# Patient Record
Sex: Female | Born: 1955 | Race: White | Hispanic: No | Marital: Single | Smoking: Former smoker
Health system: Southern US, Community
[De-identification: ages and names within clinical notes are randomized; demographics above are authoritative.]

## PROBLEM LIST (undated history)

## (undated) DIAGNOSIS — E079 Disorder of thyroid, unspecified: Secondary | ICD-10-CM

## (undated) DIAGNOSIS — I10 Essential (primary) hypertension: Secondary | ICD-10-CM

## (undated) DIAGNOSIS — E039 Hypothyroidism, unspecified: Secondary | ICD-10-CM

## (undated) DIAGNOSIS — M1990 Unspecified osteoarthritis, unspecified site: Secondary | ICD-10-CM

## (undated) DIAGNOSIS — I499 Cardiac arrhythmia, unspecified: Secondary | ICD-10-CM

## (undated) HISTORY — PX: ABDOMINAL HYSTERECTOMY: SHX81

## (undated) HISTORY — DX: Disorder of thyroid, unspecified: E07.9

## (undated) HISTORY — PX: JOINT REPLACEMENT: SHX530

## (undated) HISTORY — PX: BREAST BIOPSY: SHX20

---

## 2011-08-18 DEATH — deceased

## 2014-04-19 HISTORY — PX: OTHER SURGICAL HISTORY: SHX169

## 2018-10-24 ENCOUNTER — Other Ambulatory Visit: Payer: Self-pay | Admitting: Unknown Physician Specialty

## 2018-10-24 DIAGNOSIS — R221 Localized swelling, mass and lump, neck: Secondary | ICD-10-CM

## 2018-10-27 ENCOUNTER — Ambulatory Visit
Admission: RE | Admit: 2018-10-27 | Discharge: 2018-10-27 | Disposition: A | Discharge status: Home / Self Care | Payer: Medicare Other | Source: Ambulatory Visit | Attending: Unknown Physician Specialty | Admitting: Unknown Physician Specialty

## 2018-10-27 ENCOUNTER — Other Ambulatory Visit: Payer: Self-pay

## 2018-10-27 DIAGNOSIS — R221 Localized swelling, mass and lump, neck: Secondary | ICD-10-CM | POA: Insufficient documentation

## 2018-12-28 ENCOUNTER — Other Ambulatory Visit: Payer: Self-pay | Admitting: Internal Medicine

## 2018-12-28 DIAGNOSIS — Z1231 Encounter for screening mammogram for malignant neoplasm of breast: Secondary | ICD-10-CM

## 2018-12-28 DIAGNOSIS — Z13820 Encounter for screening for osteoporosis: Secondary | ICD-10-CM

## 2019-02-19 ENCOUNTER — Ambulatory Visit
Admission: RE | Admit: 2019-02-19 | Discharge: 2019-02-19 | Disposition: A | Discharge status: Home / Self Care | Payer: Medicare Other | Source: Ambulatory Visit | Attending: Internal Medicine | Admitting: Internal Medicine

## 2019-02-19 ENCOUNTER — Other Ambulatory Visit: Payer: Self-pay

## 2019-02-19 DIAGNOSIS — Z13820 Encounter for screening for osteoporosis: Secondary | ICD-10-CM

## 2019-02-19 DIAGNOSIS — Z1231 Encounter for screening mammogram for malignant neoplasm of breast: Secondary | ICD-10-CM

## 2019-02-21 ENCOUNTER — Other Ambulatory Visit: Payer: Self-pay | Admitting: Internal Medicine

## 2019-02-21 DIAGNOSIS — R928 Other abnormal and inconclusive findings on diagnostic imaging of breast: Secondary | ICD-10-CM

## 2019-02-23 ENCOUNTER — Ambulatory Visit: Payer: Medicare Other

## 2019-02-23 ENCOUNTER — Other Ambulatory Visit: Payer: Self-pay

## 2019-02-23 ENCOUNTER — Ambulatory Visit
Admission: RE | Admit: 2019-02-23 | Discharge: 2019-02-23 | Disposition: A | Discharge status: Home / Self Care | Payer: Medicare Other | Source: Ambulatory Visit | Attending: Internal Medicine | Admitting: Internal Medicine

## 2019-02-23 DIAGNOSIS — R928 Other abnormal and inconclusive findings on diagnostic imaging of breast: Secondary | ICD-10-CM

## 2019-03-08 ENCOUNTER — Other Ambulatory Visit: Payer: Self-pay | Admitting: Internal Medicine

## 2019-03-08 DIAGNOSIS — R921 Mammographic calcification found on diagnostic imaging of breast: Secondary | ICD-10-CM

## 2019-03-21 ENCOUNTER — Ambulatory Visit
Admission: RE | Admit: 2019-03-21 | Discharge: 2019-03-21 | Disposition: A | Discharge status: Home / Self Care | Payer: Medicare Other | Source: Ambulatory Visit | Attending: Internal Medicine | Admitting: Internal Medicine

## 2019-03-21 ENCOUNTER — Other Ambulatory Visit: Payer: Self-pay

## 2019-03-21 DIAGNOSIS — R921 Mammographic calcification found on diagnostic imaging of breast: Secondary | ICD-10-CM

## 2019-07-09 ENCOUNTER — Other Ambulatory Visit: Payer: Self-pay | Admitting: Internal Medicine

## 2019-07-09 ENCOUNTER — Ambulatory Visit
Admission: RE | Admit: 2019-07-09 | Discharge: 2019-07-09 | Disposition: A | Discharge status: Home / Self Care | Payer: Medicare Other | Source: Ambulatory Visit | Attending: Internal Medicine | Admitting: Internal Medicine

## 2019-07-09 ENCOUNTER — Other Ambulatory Visit: Payer: Self-pay

## 2019-07-09 DIAGNOSIS — R921 Mammographic calcification found on diagnostic imaging of breast: Secondary | ICD-10-CM

## 2019-12-05 ENCOUNTER — Encounter: Payer: Self-pay | Admitting: Physical Medicine and Rehabilitation

## 2020-02-29 ENCOUNTER — Other Ambulatory Visit: Payer: Self-pay

## 2020-02-29 ENCOUNTER — Ambulatory Visit
Admission: RE | Admit: 2020-02-29 | Discharge: 2020-02-29 | Disposition: A | Discharge status: Home / Self Care | Payer: Medicare Other | Source: Ambulatory Visit | Attending: Internal Medicine | Admitting: Internal Medicine

## 2020-02-29 DIAGNOSIS — R921 Mammographic calcification found on diagnostic imaging of breast: Secondary | ICD-10-CM

## 2020-06-09 ENCOUNTER — Encounter: Payer: Self-pay | Admitting: Physical Medicine and Rehabilitation

## 2020-07-07 ENCOUNTER — Encounter
Payer: Medicare Other | Attending: Physical Medicine and Rehabilitation | Admitting: Physical Medicine and Rehabilitation

## 2020-07-07 ENCOUNTER — Other Ambulatory Visit: Payer: Self-pay

## 2020-07-07 ENCOUNTER — Ambulatory Visit
Admission: RE | Admit: 2020-07-07 | Discharge: 2020-07-07 | Disposition: A | Discharge status: Home / Self Care | Payer: Medicare Other | Source: Ambulatory Visit | Attending: Physical Medicine and Rehabilitation | Admitting: Physical Medicine and Rehabilitation

## 2020-07-07 ENCOUNTER — Encounter: Payer: Self-pay | Admitting: Physical Medicine and Rehabilitation

## 2020-07-07 VITALS — BP 186/106 | HR 56 | Temp 97.9°F | Ht 66.0 in | Wt 189.4 lb

## 2020-07-07 DIAGNOSIS — M792 Neuralgia and neuritis, unspecified: Secondary | ICD-10-CM | POA: Diagnosis present

## 2020-07-07 DIAGNOSIS — M79604 Pain in right leg: Secondary | ICD-10-CM | POA: Insufficient documentation

## 2020-07-07 DIAGNOSIS — G629 Polyneuropathy, unspecified: Secondary | ICD-10-CM | POA: Insufficient documentation

## 2020-07-07 DIAGNOSIS — M545 Low back pain, unspecified: Secondary | ICD-10-CM | POA: Diagnosis present

## 2020-07-07 MED ORDER — DULOXETINE HCL 30 MG PO CPEP: 30.0000 mg | ORAL_CAPSULE | Freq: Every day | ORAL | 3 refills | Status: DC

## 2020-07-07 MED ORDER — MELOXICAM 15 MG PO TABS: 15.0000 mg | ORAL_TABLET | Freq: Every day | ORAL | 5 refills | Status: DC

## 2020-07-07 NOTE — Progress Notes (Signed)
Subjective:    Patient ID: Monique Harrell, female    DOB: December 24, 1955, 65 y.o.   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   Is fairly active- when can be- if climbs ladder, awful, hurts worse.   It's excruciating.  From R lateral hip down to R toes. Has associated   R knee pretty chronic pain R hip pain is disabling.  NO PAIN IN GROIN.   Lateral R hip and low back pain.  Above  Numb and tingling down R leg- down to R ankle laterally.  When tries to accomplish tasks, gets super aggravated.   Has gotten knee injections- gel injections. Didn't help- a few months ago.  The R knee "feels horrible". But told wasn't bad in terms of maybe moderate OA, but not severe.  Dr Madelon Lips must have placed Xray at their office, because not in system.    No pain in L side, only R side.   Pins and needles, sharp, shooting, sharp. All nerve pain Sx's.   Low dose Naltrexone- not taking now- was for weight loss? No side effects.  Hasn't occurred to her to see if could help with weight loss.   Had a Rx for Meloxicam- not sure if helped- took as needed- 3x/week or so.   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      Pain Inventory Average Pain 7 Pain Right Now 5 My pain is constant, sharp, dull, tingling and aching  In the last 24 hours, has pain interfered with the following? General activity 9 Relation with others 1 Enjoyment of life 9 What TIME of day is your pain at its worst? morning  and night Sleep (in general) Fair  Pain is worse with: some activites Pain improves with: medication and nothing  Relief from Meds: 5  walk without assistance how many minutes can you walk? varies ability to climb steps?  yes  retired  numbness tingling dizziness  Any  changes since last visit?  no  Any changes since last visit?  no    Family History  Problem Relation Age of Onset  . Breast cancer Mother   . Cancer Mother   . Cancer Father   . Cancer Brother    Social History   Socioeconomic History  . Marital status: Single    Spouse name: Not on file  . Number of children: Not on file  . Years of education: Not on file  . Highest education level: Not on file  Occupational History  . Not on file  Tobacco Use  . Smoking status: Not on file  . Smokeless tobacco: Not on file  Substance and Sexual Activity  . Alcohol use: Not on file  . Drug use: Not on file  . Sexual activity: Not on file  Other Topics Concern  . Not on file  Social History Narrative  . Not on file   Social Determinants of Health   Financial Resource Strain: Not on file  Food Insecurity: Not on file  Transportation Needs: Not on file  Physical Activity: Not on file  Stress: Not on file  Social Connections: Not on file   Past Surgical History:  Procedure Laterality Date  . ABDOMINAL HYSTERECTOMY     at  age 45  . BREAST BIOPSY Right   . JOINT REPLACEMENT Right    @ age 74 in [place] Dr Salomon Fick  . thyroidectomy  2016   Past Medical History:  Diagnosis Date  . Thyroid disease    BP (!) 186/106   Pulse (!) 56   Temp 97.9 F (36.6 C)   Ht 5\' 6"  (1.676 m)   Wt 189 lb 6.4 oz (85.9 kg)   SpO2 96%   BMI 30.57 kg/m   Opioid Risk Score:   Fall Risk Score:  `1  Depression screen PHQ 2/9  Depression screen PHQ 2/9 07/07/2020  Decreased Interest 0  Down, Depressed, Hopeless 0  PHQ - 2 Score 0  Altered sleeping 1  Tired, decreased energy 0  Change in appetite 0  Feeling bad or failure about yourself  0  Trouble concentrating 0  Moving slowly or fidgety/restless 0  Suicidal thoughts 0  PHQ-9 Score 1  Difficult doing work/chores Extremely dIfficult   , Review of Systems  Constitutional: Negative.   HENT: Negative.   Eyes: Negative.   Cardiovascular:  Negative.   Gastrointestinal: Negative.   Endocrine: Negative.   Genitourinary: Negative.   Musculoskeletal: Positive for arthralgias and gait problem.       Hip pain  Skin: Negative.   Allergic/Immunologic: Negative.   Neurological: Positive for numbness.       Tingling  Hematological: Negative.   Psychiatric/Behavioral: Negative.   All other systems reviewed and are negative.      Objective:   Physical Exam  Awake, alert, appropriate, sitting on table- keeps rubbing R lateral hip, NAD  MS: LLE- HF, KE, KF, DF and PF 5/5 RLE- HF 5-/5, KE, KF, DF and PF- 5/5 otherwise  Neuro: Intact to light touch in B/L LEs No pain with extension of lumbar spine- B/L No pain with rotation of lumbar spine (+) SLR on R Increased pain with lumbar flexion        Assessment & Plan:  Pt is a 65 yr old female with hx of HLD and hypothyroidism here for R low back pain/neuropathy  1. 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  Go to get xray at 69- can just walk in.   2. BP is very high- per pt, rarely elevated- so pt isn't concerned  3. Duloxetine   Duloxetine /Cymbalta 30 mg nightly x 1 week  Then 60 mg nightly- for nerve pain  1% of patients can have nausea with Duloxetine- call me if needs an anti-nausea medicine. Can also cause mild dry mouth/dry eyes and mild constipation. Takes 7-14 days to start to kick in.   4. Will also send in Meloxicam - 15 mg daily as needed for nerve pain.    5. F/U in 8 weeks.   6. Do HEP 5+ days/week  I spent a total of 40 minutes n appointment- as detailed above.

## 2020-07-07 NOTE — Patient Instructions (Signed)
Pt is a 66 yr old female with hx of HLD and hypothyroidism here for R low back pain/neuropathy  1. 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  Go to get xray at [facility]- can just walk in.   2. BP is very high- per pt, rarely elevated- so pt isn't concerned  3. Duloxetine   Duloxetine /Cymbalta 30 mg nightly x 1 week  Then 60 mg nightly- for nerve pain  1% of patients can have nausea with Duloxetine- call me if needs an anti-nausea medicine. Can also cause mild dry mouth/dry eyes and mild constipation. Takes 7-14 days to start to kick in.   4. Will also send in Meloxicam - 15 mg daily as needed for nerve pain.    5. F/U in 8 weeks.   6. Do Home exercise program 5 days/week.

## 2020-09-05 ENCOUNTER — Encounter: Payer: Medicare Other | Admitting: Physical Medicine and Rehabilitation

## 2021-01-22 ENCOUNTER — Other Ambulatory Visit: Payer: Self-pay | Admitting: Family Medicine

## 2021-01-22 DIAGNOSIS — R921 Mammographic calcification found on diagnostic imaging of breast: Secondary | ICD-10-CM

## 2021-04-07 ENCOUNTER — Ambulatory Visit
Admission: RE | Admit: 2021-04-07 | Discharge: 2021-04-07 | Disposition: A | Discharge status: Home / Self Care | Payer: Medicare Other | Source: Ambulatory Visit | Attending: Family Medicine | Admitting: Family Medicine

## 2021-04-07 ENCOUNTER — Other Ambulatory Visit: Payer: Self-pay

## 2021-04-07 ENCOUNTER — Other Ambulatory Visit: Payer: Self-pay | Admitting: Family Medicine

## 2021-04-07 DIAGNOSIS — R921 Mammographic calcification found on diagnostic imaging of breast: Secondary | ICD-10-CM

## 2021-04-07 DIAGNOSIS — N6320 Unspecified lump in the left breast, unspecified quadrant: Secondary | ICD-10-CM

## 2021-09-29 ENCOUNTER — Other Ambulatory Visit: Payer: Self-pay | Admitting: Family Medicine

## 2021-09-29 DIAGNOSIS — E2839 Other primary ovarian failure: Secondary | ICD-10-CM

## 2022-03-09 ENCOUNTER — Ambulatory Visit
Admission: RE | Admit: 2022-03-09 | Discharge: 2022-03-09 | Disposition: A | Discharge status: Home / Self Care | Payer: Medicare Other | Source: Ambulatory Visit | Attending: Family Medicine | Admitting: Family Medicine

## 2022-03-09 DIAGNOSIS — E2839 Other primary ovarian failure: Secondary | ICD-10-CM

## 2022-03-15 ENCOUNTER — Other Ambulatory Visit: Payer: Self-pay | Admitting: Family Medicine

## 2022-03-15 DIAGNOSIS — Z1231 Encounter for screening mammogram for malignant neoplasm of breast: Secondary | ICD-10-CM

## 2022-05-10 ENCOUNTER — Ambulatory Visit
Admission: RE | Admit: 2022-05-10 | Discharge: 2022-05-10 | Disposition: A | Discharge status: Home / Self Care | Payer: Medicare Other | Source: Ambulatory Visit | Attending: Family Medicine | Admitting: Family Medicine

## 2022-05-10 DIAGNOSIS — Z1231 Encounter for screening mammogram for malignant neoplasm of breast: Secondary | ICD-10-CM

## 2022-11-01 ENCOUNTER — Ambulatory Visit: Payer: Self-pay

## 2022-11-01 ENCOUNTER — Ambulatory Visit
Admission: EM | Admit: 2022-11-01 | Discharge: 2022-11-01 | Disposition: A | Discharge status: Home / Self Care | Payer: Medicare Other | Attending: Emergency Medicine | Admitting: Emergency Medicine

## 2022-11-01 DIAGNOSIS — S0501XA Injury of conjunctiva and corneal abrasion without foreign body, right eye, initial encounter: Secondary | ICD-10-CM

## 2022-11-01 MED ORDER — KETOROLAC TROMETHAMINE 0.5 % OP SOLN: 1.0000 [drp] | Freq: Three times a day (TID) | OPHTHALMIC | 0 refills | Status: DC | PRN

## 2022-11-01 MED ORDER — ERYTHROMYCIN 5 MG/GM OP OINT: TOPICAL_OINTMENT | OPHTHALMIC | 0 refills | Status: DC

## 2022-11-01 NOTE — Discharge Instructions (Addendum)
Today you were evaluated for your right eye pain, on exam I am able to notate a small corneal abrasion on your eye which typically will improve with time  Apply erythromycin ointment to the lower water line every morning and every evening for 7 days  Place 1 drop of ketorolac into the eye every 8 hours as needed for pain  May hold cool compresses to the eye for comfort as needed  Try to avoid eye touching or rubbing to prevent further irritation  If no improvement has been seen within 72 hours please notify your eye doctor to schedule follow-up appointment for reevaluation  For any further visual changes please go to the nearest emergency department for immediate evaluation
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
## 2023-01-05 ENCOUNTER — Ambulatory Visit
Admission: RE | Admit: 2023-01-05 | Discharge: 2023-01-05 | Disposition: A | Discharge status: Home / Self Care | Payer: Medicare Other | Source: Ambulatory Visit | Attending: Family Medicine | Admitting: Family Medicine

## 2023-01-05 ENCOUNTER — Other Ambulatory Visit: Payer: Self-pay | Admitting: Family Medicine

## 2023-01-05 DIAGNOSIS — M25551 Pain in right hip: Secondary | ICD-10-CM

## 2023-04-25 ENCOUNTER — Other Ambulatory Visit: Payer: Self-pay | Admitting: Family Medicine

## 2023-04-25 DIAGNOSIS — Z1231 Encounter for screening mammogram for malignant neoplasm of breast: Secondary | ICD-10-CM

## 2023-05-12 ENCOUNTER — Ambulatory Visit
Admission: RE | Admit: 2023-05-12 | Discharge: 2023-05-12 | Disposition: A | Discharge status: Home / Self Care | Payer: Medicare Other | Source: Ambulatory Visit | Attending: Family Medicine | Admitting: Family Medicine

## 2023-05-12 DIAGNOSIS — Z1231 Encounter for screening mammogram for malignant neoplasm of breast: Secondary | ICD-10-CM

## 2023-05-26 ENCOUNTER — Other Ambulatory Visit: Payer: Self-pay | Admitting: Student

## 2023-05-26 DIAGNOSIS — Z1211 Encounter for screening for malignant neoplasm of colon: Secondary | ICD-10-CM

## 2023-06-04 IMAGING — MG DIGITAL DIAGNOSTIC BILAT W/ TOMO W/ CAD
8 of 12 series · 8 of 32 positions shown · non-contrast
Comparison: Previous exam(s).

CLINICAL DATA: Follow-up for probably benign calcifications in the
RIGHT breast. These calcifications were originally identified in
Monday February, 2019. Patient describes previous history of RIGHT breast
surgical excision with large hematoma.

Patient also describes a new palpable lump within the inframammary
fold of the lower LEFT breast.
EXAM:
DIGITAL DIAGNOSTIC BILATERAL MAMMOGRAM WITH TOMOSYNTHESIS AND CAD;
ULTRASOUND LEFT BREAST LIMITED
TECHNIQUE: Bilateral digital diagnostic mammography and breast tomosynthesis
was performed. The images were evaluated with computer-aided
detection.; Targeted ultrasound examination of the left breast was
performed.

[R ML]
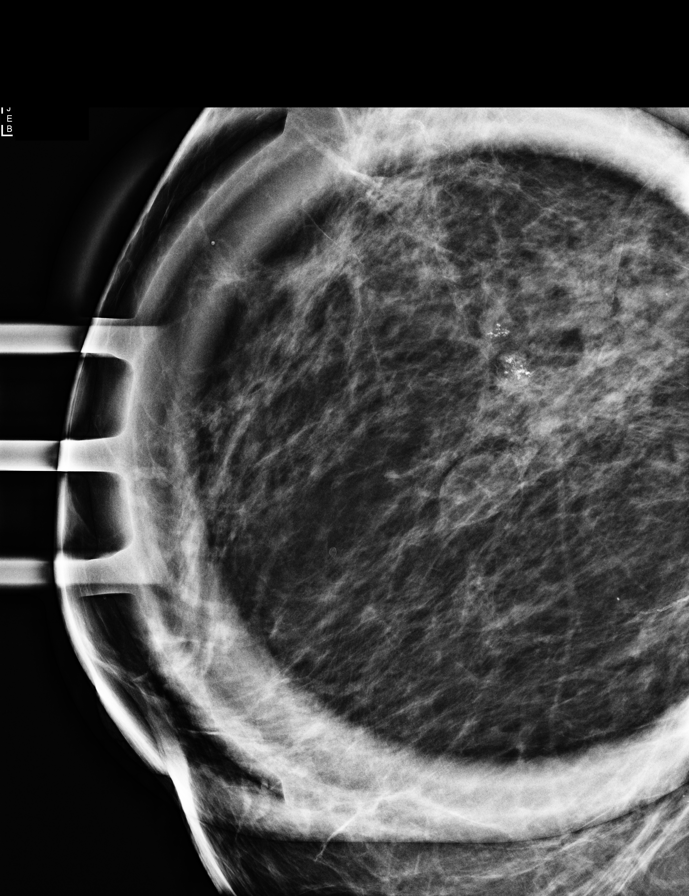

[R CC]
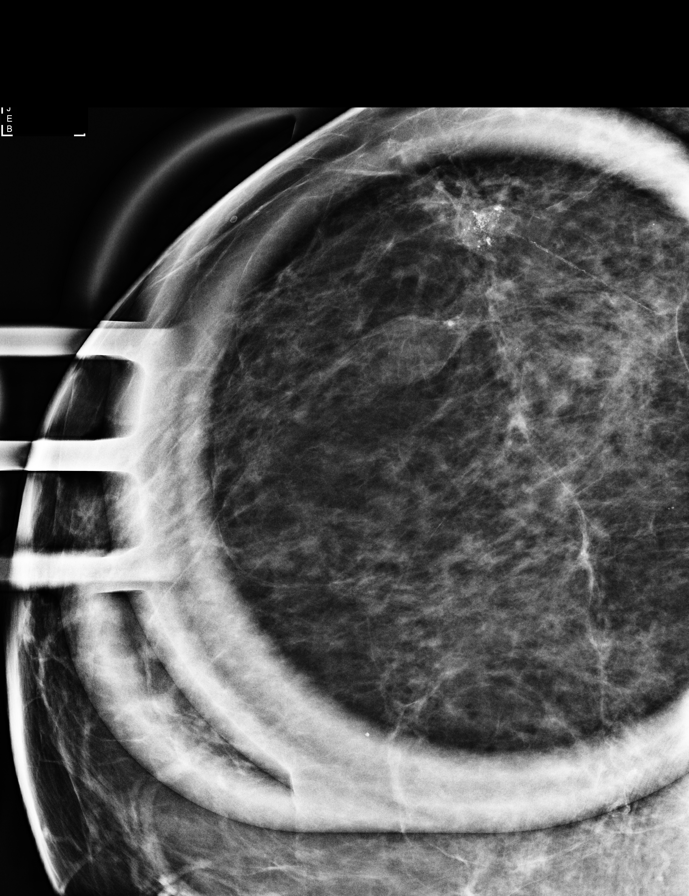

[L MLO synth-2D]
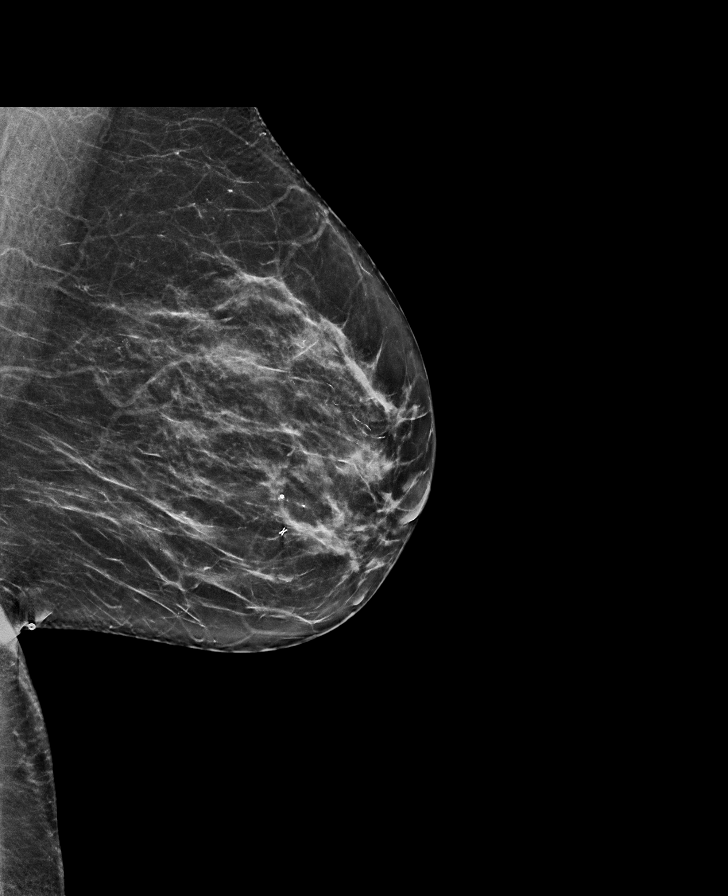

[L TAN synth-2D]
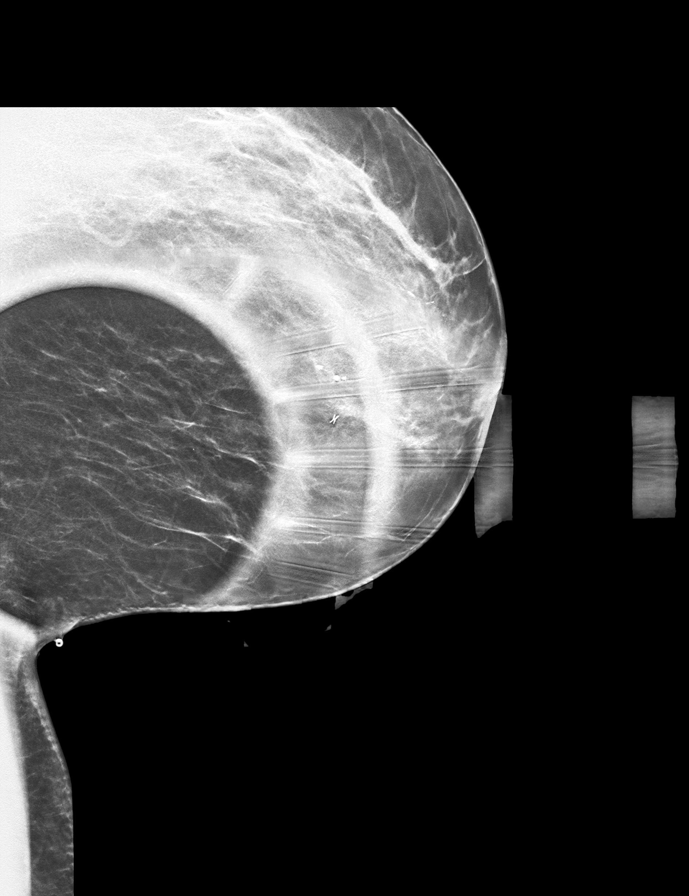

[R CC synth-2D]
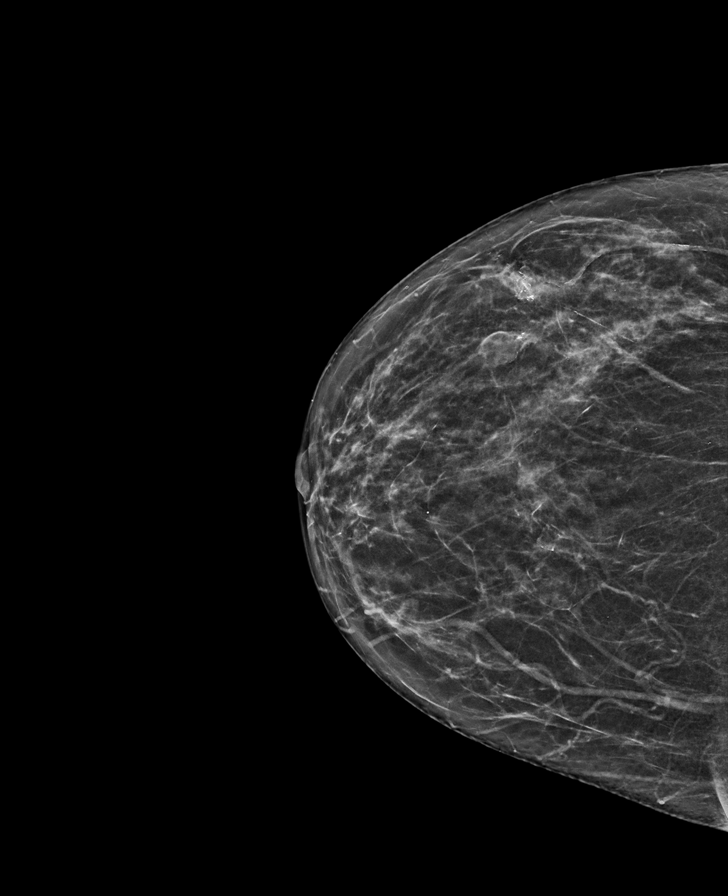

[L CC synth-2D]
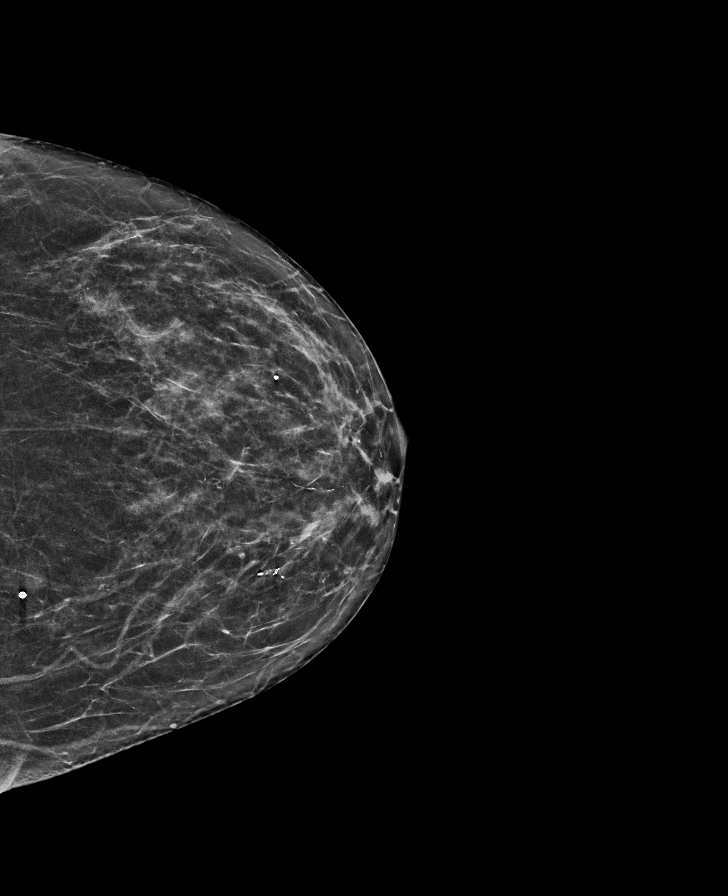

[R MLO synth-2D]
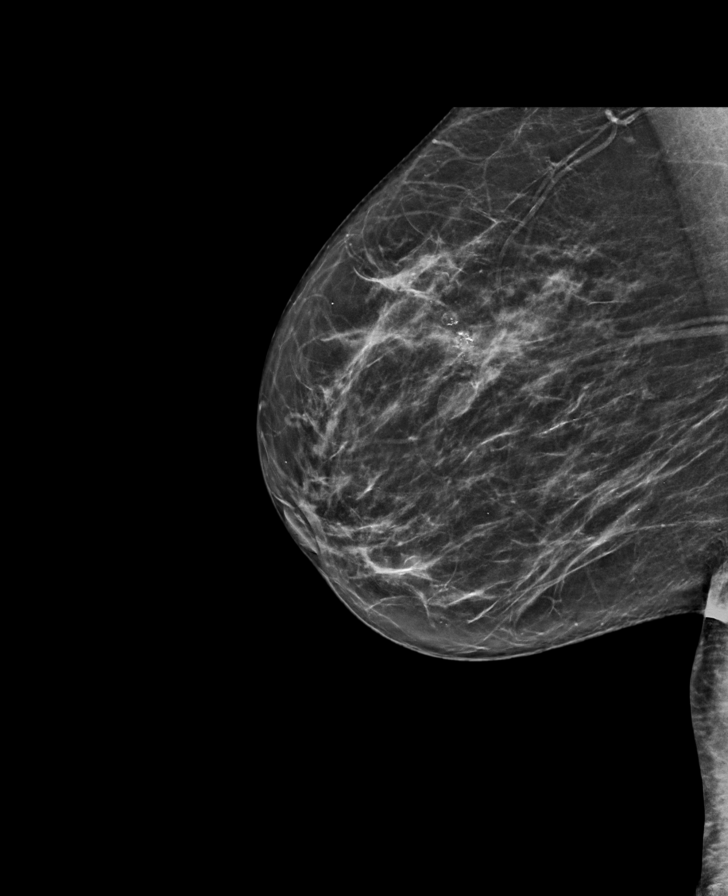

[R MLO tomo · tomo slice 36/71.0]
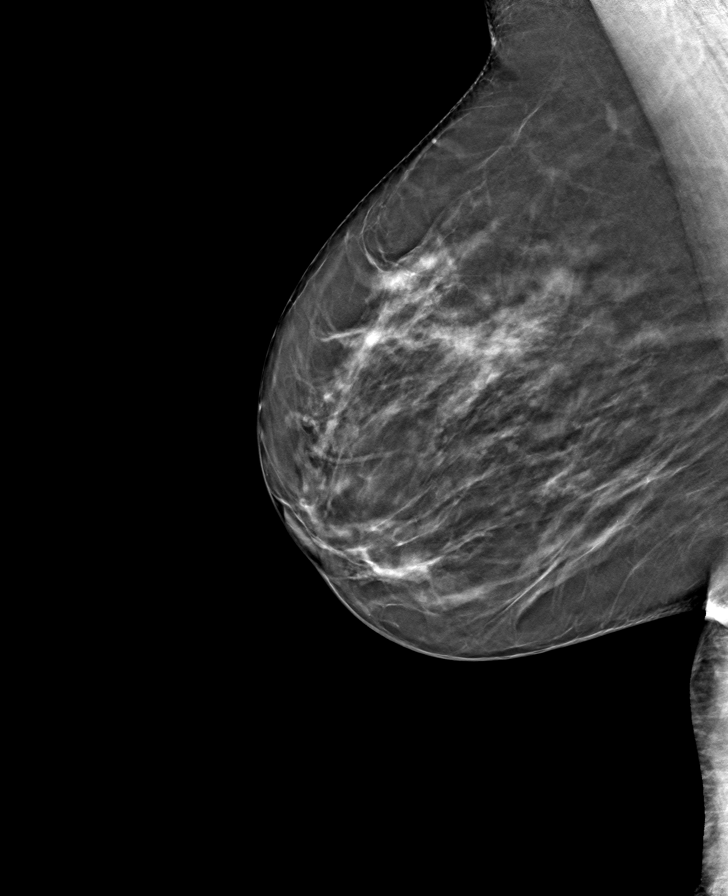

[8 of 32 positions shown; findings below may reference images not displayed]

ACR Breast Density Category b: There are scattered areas of
fibroglandular density.
FINDINGS: The coarse calcifications in the RIGHT breast are not significantly
changed for 2 years, some components increasingly coarsened,
confirming benignity. No new suspicious pleomorphic or fine linear
branching calcifications.

No new dominant masses, suspicious calcifications or secondary signs
of malignancy elsewhere within the RIGHT breast.

No new dominant masses, suspicious calcifications or secondary signs
of malignancy within the LEFT breast.

Targeted ultrasound is performed, showing a small benign sebaceous
cyst within the skin of the LEFT breast at the 8 o'clock axis, 10 cm
from the nipple, inframammary fold, measuring 3 mm, with surrounding
skin thickening, corresponding to the palpable area of concern. No
fluid collection within the breast tissues underlying the skin. No
suspicious solid or cystic mass.
IMPRESSION: 1. No evidence of malignancy within either breast.
2. Benign calcifications within the RIGHT breast, not significantly
changed since February 2019 confirming benignity, presumably benign
fat necrosis calcifications related to underlying postsurgical
change. No additional follow-up diagnostic imaging is necessary for
this benign finding.
3. Small benign sebaceous cyst within the skin of the LEFT breast at
the 8 o'clock axis, inframammary fold region, measuring 3 mm, with
associated skin thickening.

RECOMMENDATION:
1.  Screening mammogram in one year.(Code:[id number])
2. Patient was reassured that sebaceous cysts are benign findings
and typically self-limiting. Patient was informed that warm
compresses can expedite healing.

I have discussed the findings and recommendations with the patient.
If applicable, a reminder letter will be sent to the patient
regarding the next appointment.

BI-RADS CATEGORY  2: Benign.

## 2023-06-27 ENCOUNTER — Ambulatory Visit
Admission: RE | Admit: 2023-06-27 | Discharge: 2023-06-27 | Disposition: A | Discharge status: Home / Self Care | Payer: Medicare Other | Source: Ambulatory Visit | Attending: Student | Admitting: Student

## 2023-06-27 DIAGNOSIS — Z1211 Encounter for screening for malignant neoplasm of colon: Secondary | ICD-10-CM

## 2023-06-28 ENCOUNTER — Other Ambulatory Visit: Payer: Self-pay

## 2023-06-28 ENCOUNTER — Encounter (HOSPITAL_COMMUNITY): Payer: Self-pay

## 2023-06-28 ENCOUNTER — Emergency Department (HOSPITAL_COMMUNITY): Admission: EM | Admit: 2023-06-28 | Discharge: 2023-06-28 | Discharge status: Against Medical Advice

## 2023-06-28 ENCOUNTER — Emergency Department (HOSPITAL_COMMUNITY)

## 2023-06-28 DIAGNOSIS — Z5321 Procedure and treatment not carried out due to patient leaving prior to being seen by health care provider: Secondary | ICD-10-CM | POA: Diagnosis not present

## 2023-06-28 DIAGNOSIS — R42 Dizziness and giddiness: Secondary | ICD-10-CM | POA: Diagnosis not present

## 2023-06-28 DIAGNOSIS — R0602 Shortness of breath: Secondary | ICD-10-CM | POA: Insufficient documentation

## 2023-06-28 MED ORDER — ALBUTEROL SULFATE HFA 108 (90 BASE) MCG/ACT IN AERS: 2.0000 | INHALATION_SPRAY | RESPIRATORY_TRACT | Status: DC | PRN

## 2023-06-28 NOTE — ED Notes (Signed)
Patient called x 2 for room with no response °

## 2023-06-28 NOTE — ED Triage Notes (Signed)
 Pt cam in via POV d/t feeling her heart flutter, lightheaded, SOB, thought she was seeing spots & her extremities felt weak. Pt endorses that happened for a few hrs then it stopped. A/Ox4, denies pain. Still feels SOB at this time.

## 2023-08-14 NOTE — Progress Notes (Signed)
 Cardiology Office Note:    Date:  08/18/2023   ID:  Monique Harrell, DOB 13-Jul-1955, MRN [id number]  PCP:  Forestine Igo, DO  Cardiologist:  None  Electrophysiologist:  None   Referring MD: Ulysees Gander, MD   Chief Complaint  Patient presents with   Atrial Fibrillation    History of Present Illness:    Monique Harrell is a 68 y.o. female with a hx of hypertension, hyperlipidemia, hypothyroidism who is referred by Dr. Mason Sole for evaluation of atrial fibrillation.  She reports she has been having episodes of dizziness and shortness of breath.  States that she has had palpitations for years.  She denies any syncope.  Denies any chest pain or lower extremity edema.  She has smoked since age 27, up to 1 pack/day but currently 0 to 1 cigarettes/day.  Family history includes daughter has transposition of great vessels.  Echocardiogram 03/09/2022: EF 55 to 60%, mild LVH.  Zio patch x 14 days 07/2023 showed 90 episodes of SVT, longest lasting 17 seconds with average rate 143 bpm, 2% atrial fibrillation burden with average rate 130 bpm and longest episode lasting 5 hours.  Reports having episodes of dizzines and shortnesso f breaht.  Has ahd palpittons for years. No chest pain.  No syncope.  No LE edema. Smoking 0-1 cigs/day, but smoked up to 1ppd 20 years; has smoked since 16.  Daughter has transposition of great arteries, diagnosed in her 30s.    Past Medical History:  Diagnosis Date   Thyroid  disease     Past Surgical History:  Procedure Laterality Date   ABDOMINAL HYSTERECTOMY     at age 46   BREAST BIOPSY Right    BREAST BIOPSY Left    JOINT REPLACEMENT Right    @ age 26 in [place]  Dr Arliss Lam   thyroidectomy  2016    Current Medications: Current Meds  Medication Sig   alendronate (FOSAMAX) 70 MG tablet Take 70 mg by mouth once a week.   amLODipine (NORVASC) 5 MG tablet Take 1 tablet (5 mg total) by mouth daily.   apixaban  (ELIQUIS ) 5 MG TABS tablet Take 1 tablet (5 mg  total) by mouth 2 (two) times daily.   atorvastatin (LIPITOR) 10 MG tablet Take 10 mg by mouth at bedtime.   levothyroxine (SYNTHROID) 125 MCG tablet Take 125 mcg by mouth daily.   metoprolol  tartrate (LOPRESSOR ) 25 MG tablet Take 1 tablet (25 mg total) by mouth daily.   [DISCONTINUED] meloxicam  (MOBIC ) 15 MG tablet Take 1 tablet (15 mg total) by mouth daily.     Allergies:   Patient has no known allergies.   Social History   Socioeconomic History   Marital status: Single    Spouse name: Not on file   Number of children: Not on file   Years of education: Not on file   Highest education level: Not on file  Occupational History   Not on file  Tobacco Use   Smoking status: Former    Current packs/day: 0.00    Types: Cigarettes    Quit date: 09/06/2008    Years since quitting: 14.9   Smokeless tobacco: Never  Vaping Use   Vaping status: Never Used  Substance and Sexual Activity   Alcohol use: Not Currently   Drug use: Never   Sexual activity: Not Currently  Other Topics Concern   Not on file  Social History Narrative   Not on file   Social Drivers of Health  Financial Resource Strain: Not on file  Food Insecurity: Not on file  Transportation Needs: Not on file  Physical Activity: Not on file  Stress: Not on file  Social Connections: Not on file     Family History: The patient's family history includes Breast cancer in her mother; Cancer in her brother, father, and mother.  ROS:   Please see the history of present illness.     All other systems reviewed and are negative.  EKGs/Labs/Other Studies Reviewed:    The following studies were reviewed today:   EKG:  06/28/2023: Atrial fibrillation, rate 140  Recent Labs: No results found for requested labs within last 365 days.  Recent Lipid Panel No results found for: "CHOL", "TRIG", "HDL", "CHOLHDL", "VLDL", "LDLCALC", "LDLDIRECT"  Physical Exam:    VS:  BP 136/80 (BP Location: Left Arm, Patient Position:  Sitting, Cuff Size: Normal)   Pulse 70   Ht 5\' 6"  (1.676 m)   Wt 189 lb (85.7 kg)   SpO2 97%   BMI 30.51 kg/m     Wt Readings from Last 3 Encounters:  08/18/23 189 lb (85.7 kg)  06/28/23 180 lb (81.6 kg)  11/01/22 185 lb (83.9 kg)     GEN:  Well nourished, well developed in no acute distress HEENT: Normal NECK: No JVD; No carotid bruits LYMPHATICS: No lymphadenopathy CARDIAC: RRR, no murmurs, rubs, gallops RESPIRATORY:  Clear to auscultation without rales, wheezing or rhonchi  ABDOMEN: Soft, non-tender, non-distended MUSCULOSKELETAL:  No edema; No deformity  SKIN: Warm and dry NEUROLOGIC:  Alert and oriented x 3 PSYCHIATRIC:  Normal affect   ASSESSMENT:    1. Paroxysmal atrial fibrillation (HCC)   2. Essential hypertension   3. Hyperlipidemia, unspecified hyperlipidemia type   4. Daytime somnolence   5. Snoring    PLAN:    Atrial fibrillation: Zio patch x 14 days 07/2023 showed 90 episodes of SVT, longest lasting 17 seconds with average rate 143 bpm, 2% atrial fibrillation burden with average rate 130 bpm and longest episode lasting 5 hours.  CHA2DS2-VASc 3 (hypertension, age, female). Echocardiogram 03/09/2022: EF 55 to 60%, mild LVH.  - Recommend starting Eliquis  5 mg twice daily.  She takes daily meloxicam  for arthritis, discussed that would not recommend taking daily NSAIDs while on Eliquis  and instead recommend Tylenol for pain.  She is willing to try this for short-term but does not wish to take long-term anticoagulation.  Will refer to EP for watchman evaluation - Start metoprolol  25 mg twice daily - Check echocardiogram - Sleep study - She reports recent labs checked with PCP, will obtain records  Hypertension: On amlodipine 5 mg daily.  Add metoprolol  as above  Hyperlipidemia: On atorvastatin 40 mg daily  Snoring/daytime somnolence: Check [facility] sleep study.  STOP-BANG 4  RTC in 3 months  Medication Adjustments/Labs and Tests Ordered: Current medicines are  reviewed at length with the patient today.  Concerns regarding medicines are outlined above.  Orders Placed This Encounter  Procedures   Ambulatory referral to Cardiac Electrophysiology   ECHOCARDIOGRAM COMPLETE   [facility] Sleep Study   Meds ordered this encounter  Medications   apixaban  (ELIQUIS ) 5 MG TABS tablet    Sig: Take 1 tablet (5 mg total) by mouth 2 (two) times daily.    Dispense:  90 tablet    Refill:  3   metoprolol  tartrate (LOPRESSOR ) 25 MG tablet    Sig: Take 1 tablet (25 mg total) by mouth daily.    Dispense:  90 tablet  Refill:  3    Patient Instructions  Medication Instructions:  Start Eliquis  5mg   twice a day Start Metoprolol  25 mg  Daily Stop Meloxicam  as directed by your provider Take tylenol as needed for pain  *If you need a refill on your cardiac medications before your next appointment, please call your pharmacy*  Lab Work: none If you have labs (blood work) drawn today and your tests are completely normal, you will receive your results only by: MyChart Message (if you have MyChart) OR A paper copy in the mail If you have any lab test that is abnormal or we need to change your treatment, we will call you to review the results.  Testing/Procedures: Echo Your physician has requested that you have an echocardiogram. Echocardiography is a painless test that uses sound waves to create images of your heart. It provides your doctor with information about the size and shape of your heart and how well your heart's chambers and valves are working. This procedure takes approximately one hour. There are no restrictions for this procedure. Please do NOT wear cologne, perfume, aftershave, or lotions (deodorant is allowed). Please arrive 15 minutes prior to your appointment time.  Please note: We ask at that you not bring children with you during ultrasound (echo/ vascular) testing. Due to room size and safety concerns, children are not allowed in the ultrasound  rooms during exams. Our front office staff cannot provide observation of children in our lobby area while testing is being conducted. An adult accompanying a patient to their appointment will only be allowed in the ultrasound room at the discretion of the ultrasound technician under special circumstances. We apologize for any inconvenience.    WatchPAT?  Is a FDA cleared portable home sleep study test that uses a watch and 3 points of contact to monitor 7 different channels, including your heart rate, oxygen saturations, body position, snoring, and chest motion.  The study is easy to use from the comfort of your own home and accurately detect sleep apnea.  Before bed, you attach the chest sensor, attached the sleep apnea bracelet to your nondominant hand, and attach the finger probe.  After the study, the raw data is downloaded from the watch and scored for apnea events.   For more information: [email or web address]  Patient Testing Instructions:  Do not put battery into the device until bedtime when you are ready to begin the test. Please call the support number if you need assistance after following the instructions below: 24 hour support line- [phone] or [facility] support at [phone] (option 2)  Download the IntelWatchPAT One" app through the google play store or App Store  Be sure to turn on or enable access to bluetooth in settlings on your smartphone/ device  Make sure no other bluetooth devices are on and within the vicinity of your smartphone/ device and WatchPAT watch during testing.  Make sure to leave your smart phone/ device plugged in and charging all night.  When ready for bed:  Follow the instructions step by step in the WatchPAT One App to activate the testing device. For additional instructions, including video instruction, visit the WatchPAT One video on Youtube. You can search for WatchPat One within Youtube (video is 4 minutes and 18 seconds) or enter:  https://youtube/[email or web address]?[email or web address]_[email or web address] Please note: You will be prompted to enter a Pin to connect via bluetooth when starting the test. The PIN will be assigned to you when you receive the test.  The device is disposable, but it  recommended that you retain the device until you receive a call letting you know the study has been received and the results have been interpreted.  We will let you know if the study did not transmit to us  properly after the test is completed. You do not need to call us  to confirm the receipt of the test.  Please complete the test within 48 hours of receiving PIN.   Frequently Asked Questions:  What is Watch Deatra Face one?  A single use fully disposable home sleep apnea testing device and will not need to be returned after completion.  What are the requirements to use WatchPAT one?  The be able to have a successful watchpat one sleep study, you should have your Watch pat one device, your smart phone, watch pat one app, your PIN number and Internet access What type of phone do I need?  You should have a smart phone that uses Android 5.1 and above or any Iphone with IOS 10 and above How can I download the WatchPAT one app?  Based on your device type search for WatchPAT one app either in google play for android devices or APP store for Iphone's Where will I get my PIN for the study?  Your PIN will be provided by your physician's office. It is used for authentication and if you lose/forget your PIN, please reach out to your providers office.  I do not have Internet at home. Can I do WatchPAT one study?  WatchPAT One needs Internet connection throughout the night to be able to transmit the sleep data. You can use your home/local internet or your cellular's data package. However, it is always recommended to use home/local Internet. It is estimated that between 20MB-30MB will be used with each study.However, the application will be looking for space in the phone to start the study.   What happens if I lose internet or bluetooth connection?  During the internet disconnection, your phone will not be able to transmit the sleep data. All the data, will be stored in your phone. As soon as the internet connection is back on, the phone will being sending the sleep data. During the bluetooth disconnection, WatchPAT one will not be able to to send the sleep data to your phone. Data will be kept in the WatchPAT one until two devices have bluetooth connection back on. As soon as the connection is back on, WatchPAT one will send the sleep data to the phone.  How long do I need to wear the WatchPAT one?  After you start the study, you should wear the device at least 6 hours.  How far should I keep my phone from the device?  During the night, your phone should be within 15 feet.  What happens if I leave the room for restroom or other reasons?  Leaving the room for any reason will not cause any problem. As soon as your get back to the room, both devices will reconnect and will continue to send the sleep data. Can I use my phone during the sleep study?  Yes, you can use your phone as usual during the study. But it is recommended to put your watchpat one on when you are ready to go to bed.  How will I get my study results?  A soon as you completed your study, your sleep data will be sent to the provider. They will then share the results with you when they are ready.     Follow-Up: At [facility], you and your health needs are our priority.  As part of our continuing mission to provide you with exceptional heart care, our providers are all part of one team.  This team includes your primary Cardiologist (physician) and Advanced Practice Providers or APPs (Physician Assistants and Nurse Practitioners) who all work together to provide you with the care you need, when you need it.  Your next appointment:   3 month(s)  Provider:   Dr. Alda Amas  We recommend signing up for the patient  portal called "MyChart".  Sign up information is provided on this After Visit Summary.  MyChart is used to connect with patients for Virtual Visits (Telemedicine).  Patients are able to view lab/test results, encounter notes, upcoming appointments, etc.  Non-urgent messages can be sent to your provider as well.   To learn more about what you can do with MyChart, go to [email or web address].   Other Instructions Referral EP Dr. Marven Slimmer             Signed, Wendie Hamburg, MD  08/18/2023 5:36 PM    [facility]

## 2023-08-17 ENCOUNTER — Encounter (HOSPITAL_BASED_OUTPATIENT_CLINIC_OR_DEPARTMENT_OTHER): Payer: Self-pay

## 2023-08-18 ENCOUNTER — Telehealth: Payer: Self-pay | Admitting: Radiology

## 2023-08-18 ENCOUNTER — Encounter: Payer: Self-pay | Admitting: Cardiology

## 2023-08-18 ENCOUNTER — Ambulatory Visit: Attending: Cardiology | Admitting: Cardiology

## 2023-08-18 VITALS — BP 136/80 | HR 70 | Ht 66.0 in | Wt 189.0 lb

## 2023-08-18 DIAGNOSIS — I10 Essential (primary) hypertension: Secondary | ICD-10-CM | POA: Diagnosis not present

## 2023-08-18 DIAGNOSIS — R0683 Snoring: Secondary | ICD-10-CM

## 2023-08-18 DIAGNOSIS — R400 Somnolence: Secondary | ICD-10-CM | POA: Diagnosis not present

## 2023-08-18 DIAGNOSIS — I480 Paroxysmal atrial fibrillation: Secondary | ICD-10-CM | POA: Diagnosis not present

## 2023-08-18 DIAGNOSIS — E785 Hyperlipidemia, unspecified: Secondary | ICD-10-CM

## 2023-08-18 MED ORDER — METOPROLOL TARTRATE 25 MG PO TABS: 25.0000 mg | ORAL_TABLET | Freq: Every day | ORAL | 3 refills | Status: DC

## 2023-08-18 MED ORDER — APIXABAN 5 MG PO TABS: 5.0000 mg | ORAL_TABLET | Freq: Two times a day (BID) | ORAL | 3 refills | Status: DC

## 2023-08-18 NOTE — Telephone Encounter (Signed)
 Patient agreement reviewed and signed on 08/18/2023.  WatchPAT issued to patient on 08/18/2023 by Nathalie Baize. Patient aware to not open the WatchPAT box until contacted with the activation PIN. Patient profile initialized in CloudPAT on 08/18/2023 by Jenise Mixer. Device serial number: [id number]  Please list Reason for Call as Advice Only and type "WatchPAT issued to patient" in the comment box.

## 2023-08-18 NOTE — Patient Instructions (Signed)
 Medication Instructions:  Start Eliquis  5mg   twice a day Start Metoprolol  25 mg  Daily Stop Meloxicam  as directed by your provider Take tylenol as needed for pain  *If you need a refill on your cardiac medications before your next appointment, please call your pharmacy*  Lab Work: none If you have labs (blood work) drawn today and your tests are completely normal, you will receive your results only by: MyChart Message (if you have MyChart) OR A paper copy in the mail If you have any lab test that is abnormal or we need to change your treatment, we will call you to review the results.  Testing/Procedures: Echo Your physician has requested that you have an echocardiogram. Echocardiography is a painless test that uses sound waves to create images of your heart. It provides your doctor with information about the size and shape of your heart and how well your heart's chambers and valves are working. This procedure takes approximately one hour. There are no restrictions for this procedure. Please do NOT wear cologne, perfume, aftershave, or lotions (deodorant is allowed). Please arrive 15 minutes prior to your appointment time.  Please note: We ask at that you not bring children with you during ultrasound (echo/ vascular) testing. Due to room size and safety concerns, children are not allowed in the ultrasound rooms during exams. Our front office staff cannot provide observation of children in our lobby area while testing is being conducted. An adult accompanying a patient to their appointment will only be allowed in the ultrasound room at the discretion of the ultrasound technician under special circumstances. We apologize for any inconvenience.    WatchPAT?  Is a FDA cleared portable home sleep study test that uses a watch and 3 points of contact to monitor 7 different channels, including your heart rate, oxygen saturations, body position, snoring, and chest motion.  The study is easy to use  from the comfort of your own home and accurately detect sleep apnea.  Before bed, you attach the chest sensor, attached the sleep apnea bracelet to your nondominant hand, and attach the finger probe.  After the study, the raw data is downloaded from the watch and scored for apnea events.   For more information: [email or web address]  Patient Testing Instructions:  Do not put battery into the device until bedtime when you are ready to begin the test. Please call the support number if you need assistance after following the instructions below: 24 hour support line- [phone] or ITAMAR support at [phone] (option 2)  Download the IntelWatchPAT One" app through the google play store or App Store  Be sure to turn on or enable access to bluetooth in settlings on your smartphone/ device  Make sure no other bluetooth devices are on and within the vicinity of your smartphone/ device and WatchPAT watch during testing.  Make sure to leave your smart phone/ device plugged in and charging all night.  When ready for bed:  Follow the instructions step by step in the WatchPAT One App to activate the testing device. For additional instructions, including video instruction, visit the WatchPAT One video on Youtube. You can search for WatchPat One within Youtube (video is 4 minutes and 18 seconds) or enter: https://youtube/watch?[email or web address]_vbiwxE Please note: You will be prompted to enter a Pin to connect via bluetooth when starting the test. The PIN will be assigned to you when you receive the test.  The device is disposable, but it recommended that you retain the device until you receive a  call letting you know the study has been received and the results have been interpreted.  We will let you know if the study did not transmit to us  properly after the test is completed. You do not need to call us  to confirm the receipt of the test.  Please complete the test within 48 hours of receiving PIN.    Frequently Asked Questions:  What is Watch Deatra Face one?  A single use fully disposable home sleep apnea testing device and will not need to be returned after completion.  What are the requirements to use WatchPAT one?  The be able to have a successful watchpat one sleep study, you should have your Watch pat one device, your smart phone, watch pat one app, your PIN number and Internet access What type of phone do I need?  You should have a smart phone that uses Android 5.1 and above or any Iphone with IOS 10 and above How can I download the WatchPAT one app?  Based on your device type search for WatchPAT one app either in google play for android devices or APP store for Iphone's Where will I get my PIN for the study?  Your PIN will be provided by your physician's office. It is used for authentication and if you lose/forget your PIN, please reach out to your providers office.  I do not have Internet at home. Can I do WatchPAT one study?  WatchPAT One needs Internet connection throughout the night to be able to transmit the sleep data. You can use your home/local internet or your cellular's data package. However, it is always recommended to use home/local Internet. It is estimated that between 20MB-30MB will be used with each study.However, the application will be looking for space in the phone to start the study.  What happens if I lose internet or bluetooth connection?  During the internet disconnection, your phone will not be able to transmit the sleep data. All the data, will be stored in your phone. As soon as the internet connection is back on, the phone will being sending the sleep data. During the bluetooth disconnection, WatchPAT one will not be able to to send the sleep data to your phone. Data will be kept in the WatchPAT one until two devices have bluetooth connection back on. As soon as the connection is back on, WatchPAT one will send the sleep data to the phone.  How long do I need  to wear the WatchPAT one?  After you start the study, you should wear the device at least 6 hours.  How far should I keep my phone from the device?  During the night, your phone should be within 15 feet.  What happens if I leave the room for restroom or other reasons?  Leaving the room for any reason will not cause any problem. As soon as your get back to the room, both devices will reconnect and will continue to send the sleep data. Can I use my phone during the sleep study?  Yes, you can use your phone as usual during the study. But it is recommended to put your watchpat one on when you are ready to go to bed.  How will I get my study results?  A soon as you completed your study, your sleep data will be sent to the provider. They will then share the results with you when they are ready.     Follow-Up: At [facility], you and your health needs are our  priority.  As part of our continuing mission to provide you with exceptional heart care, our providers are all part of one team.  This team includes your primary Cardiologist (physician) and Advanced Practice Providers or APPs (Physician Assistants and Nurse Practitioners) who all work together to provide you with the care you need, when you need it.  Your next appointment:   3 month(s)  Provider:   Dr. Alda Amas  We recommend signing up for the patient portal called "MyChart".  Sign up information is provided on this After Visit Summary.  MyChart is used to connect with patients for Virtual Visits (Telemedicine).  Patients are able to view lab/test results, encounter notes, upcoming appointments, etc.  Non-urgent messages can be sent to your provider as well.   To learn more about what you can do with MyChart, go to [email or web address].   Other Instructions Referral EP Dr. Marven Slimmer

## 2023-08-19 ENCOUNTER — Other Ambulatory Visit (HOSPITAL_COMMUNITY): Payer: Self-pay

## 2023-08-19 ENCOUNTER — Telehealth: Payer: Self-pay | Admitting: Cardiology

## 2023-08-19 ENCOUNTER — Telehealth: Payer: Self-pay | Admitting: Pharmacy Technician

## 2023-08-19 MED ORDER — APIXABAN 5 MG PO TABS: 5.0000 mg | ORAL_TABLET | Freq: Two times a day (BID) | ORAL | Status: DC

## 2023-08-19 NOTE — Telephone Encounter (Signed)
 Spoke to patient and mailed her a eliquis  application for [employer]. It says on the eliquis  website to contact the provider office for a free coupon. Do you guys have those?

## 2023-08-19 NOTE — Telephone Encounter (Signed)
 Patient states this medication is too expensive for her, would like assistance. Would like a more affordable situation until she see's Dr. Marven Slimmer for watchman eval on 5/14.   Patient would like Tramadol prescribed instead of the Meloxicam  due to it working much more effectively.

## 2023-08-19 NOTE — Telephone Encounter (Signed)
 PAP: Patient assistance application for Eliquis  through [employer] ([employer]) has been mailed to pt's home address on file.

## 2023-08-19 NOTE — Telephone Encounter (Signed)
 Patient states she cannot afford cost of Eliquis . She states she does have a part D plan with her insurance. Encouraged her to contact her insurance to discuss payment plan options/deductible.  Patient is also asking for prescription of Tramadol for arthritis since she cannot take meloxicam  with Eliquis . Informed patient we do not prescribe Tramadol, she will need to contact her PCP about this.  Will forward to Rx Med Assistance Team for patient assistance.  Patient verbalized understanding of the above and expressed appreciation for call.

## 2023-08-19 NOTE — Telephone Encounter (Signed)
 Patient identification verified by 2 forms. Sims Duck, RN     See previous encounter. Samples provided.

## 2023-08-19 NOTE — Addendum Note (Signed)
 Addended by: Christine Cozier on: 08/19/2023 04:10 PM   Modules accepted: Orders

## 2023-08-19 NOTE — Telephone Encounter (Signed)
 Pt c/o medication issue:  1. Name of Medication: apixaban  (ELIQUIS ) 5 MG TABS tablet   2. How are you currently taking this medication (dosage and times per day)? N/A  3. Are you having a reaction (difficulty breathing--STAT)? N/A  4. What is your medication issue? Pt requesting patient assistance for this medication. Please advise

## 2023-08-22 ENCOUNTER — Encounter: Payer: Self-pay | Admitting: *Deleted

## 2023-08-22 NOTE — Progress Notes (Unsigned)
 Medical Records requested from PCP [facility] on 5/5 per Dr. Alda Amas request.

## 2023-08-22 NOTE — Telephone Encounter (Signed)
 Pt came into the office to get a 30 day free coupon. I provided pt with the coupon, left the coupon at front desk [facility] [address] office. Pt is aware.

## 2023-08-22 NOTE — Telephone Encounter (Signed)
 Pt came in office and no coupon was provided and pt would like a c/b.

## 2023-08-30 NOTE — Progress Notes (Unsigned)
 Electrophysiology Office Note:    Date:  08/31/2023   ID:  Selene, Hollinger 1955/11/25, MRN [id number]  [facility] Cardiologist:  None  [facility] Electrophysiologist:  Boyce Byes, MD   Referring MD: Wendie Hamburg*   Chief Complaint: Atrial fibrillation  History of Present Illness:    Ms. Bauers is a 68 year old woman who I am seeing today for an evaluation of atrial fibrillation at the request of Dr. Alda Amas.  The patient has a history of hypertension, hyperlipidemia, hypothyroidism.  She saw Dr. Alda Amas Aug 18, 2023.  She reported years of palpitations.  A ZIO monitor in April of this year showed a 2% burden of atrial fibrillation with an average rate during atrial fibrillation of 130 bpm.  She reports episodes of dizziness and shortness of breath.  She was started on Eliquis  for stroke prophylaxis.  She does take daily NSAIDs for arthritis.  At the last appointment with Dr. Alda Amas she reported a desire to avoid long-term exposure to anticoagulation and is being referred to discuss possible left atrial appendage occlusion.  She reports being short of breath and fatigued while in atrial fibrillation.  She says when she first went in A-fib a few days ago she felt terrible but her symptoms have gradually improved.  She cannot really tell if she is out of rhythm right now.    Their past medical, social and family history was reviewed.   ROS:   Please see the history of present illness.    All other systems reviewed and are negative.  EKGs/Labs/Other Studies Reviewed:    The following studies were reviewed today:  June 28, 2023 EKG shows atrial fibrillation with a ventricular rate of 140 bpm  ZIO monitor personally reviewed (media tab, March/April 2025) Atrial fibrillation      Physical Exam:    VS:  BP 122/78   Pulse 62   Ht 5\' 6"  (1.676 m)   Wt 183 lb 9.6 oz (83.3 kg)   SpO2 98%   BMI 29.63 kg/m     Wt Readings from Last 3  Encounters:  08/31/23 183 lb 9.6 oz (83.3 kg)  08/18/23 189 lb (85.7 kg)  06/28/23 180 lb (81.6 kg)     GEN: no distress CARD: Irregularly irregular, tachycardic (discordant with measured pulse rate), No MRG RESP: No IWOB. CTAB.        ASSESSMENT AND PLAN:    1. Paroxysmal atrial fibrillation (HCC)   2. Essential hypertension     #Atrial fibrillation Symptomatic On Eliquis  for stroke prophylaxis but prefers a stroke risk mitigation strategy that avoids indefinite exposure to anticoagulation given patient preference and desire to take NSAIDs for pain.  I discussed rhythm control strategies for her atrial fibrillation and stroke risk mitigation strategies.  I discussed conservative therapy, antiarrhythmic drugs and catheter ablation.  I discussed anticoagulation and left atrial appendage occlusion.  I think she is an acceptable candidate for concomitant ablation/watchman.  Discussed treatment options today for AF including antiarrhythmic drug therapy and ablation. Discussed risks, recovery and likelihood of success with each treatment strategy. Risk, benefits, and alternatives to EP study and ablation for afib were discussed. These risks include but are not limited to stroke, bleeding, vascular damage, tamponade, perforation, damage to the esophagus, lungs, phrenic nerve and other structures, pulmonary vein stenosis, worsening renal function, coronary vasospasm and death.  Discussed potential need for repeat ablation procedures and antiarrhythmic drugs after an initial ablation. The patient understands these risk and wishes to proceed.  We will therefore proceed with catheter ablation at the next available time.  Carto, ICE, anesthesia are requested for the procedure.  Will also obtain CT PV protocol prior to the procedure to exclude LAA thrombus and further evaluate atrial anatomy.  -------------------  I have seen Laverda Poster in the office today who is being considered for a  Watchman left atrial appendage closure device. I believe they will benefit from this procedure given their history of atrial fibrillation, CHA2DS2-VASc score of 3 and unadjusted ischemic stroke rate of 3.2% per year. Unfortunately, the patient is not felt to be a long term anticoagulation candidate secondary to chronic NSAID use and patient preference. The patient's chart has been reviewed and I feel that they would be a candidate for short term oral anticoagulation after Watchman implant.   It is my belief that after undergoing a LAA closure procedure, Laverda Poster will not need long term anticoagulation which eliminates anticoagulation side effects and major bleeding risk.   Procedural risks for the Watchman implant have been reviewed with the patient including a 0.5% risk of stroke, <1% risk of perforation and <1% risk of device embolization. Other risks include bleeding, vascular damage, tamponade, worsening renal function, and death. The patient understands these risk and wishes to proceed.     The published clinical data on the safety and effectiveness of WATCHMAN include but are not limited to the following: - Holmes DR, Evalene Hilda, Sick P et al. for the PROTECT AF Investigators. Percutaneous closure of the left atrial appendage versus warfarin therapy for prevention of stroke in patients with atrial fibrillation: a randomised non-inferiority trial. Lancet 2009; 374: 534-42. Evalene Hilda, Doshi SK, Deloria Fetch D et al. on behalf of the PROTECT AF Investigators. Percutaneous Left Atrial Appendage Closure for Stroke Prophylaxis in Patients With Atrial Fibrillation 2.3-Year Follow-up of the PROTECT AF (Watchman Left Atrial Appendage System for Embolic Protection in Patients With Atrial Fibrillation) Trial. Circulation 2013; 127:720-729. - Alli O, Doshi S,  Kar S, Reddy VY, Sievert H et al. Quality of Life Assessment in the Randomized PROTECT AF (Percutaneous Closure of the Left Atrial  Appendage Versus Warfarin Therapy for Prevention of Stroke in Patients With Atrial Fibrillation) Trial of Patients at Risk for Stroke With Nonvalvular Atrial Fibrillation. J Am Coll Cardiol 2013; 61:1790-8. Bartholome Ligas DR, Mario Sicard, Price M, Whisenant B, Sievert H, Doshi S, Huber K, Reddy V. Prospective randomized evaluation of the Watchman left atrial appendage Device in patients with atrial fibrillation versus long-term warfarin therapy; the PREVAIL trial. Journal of the [employer] of Cardiology, Vol. 4, No. 1, 2014, 1-11. - Kar S, Doshi SK, Sadhu A, Horton R, Osorio J et al. Primary outcome evaluation of a next-generation left atrial appendage closure device: results from the PINNACLE FLX trial. Circulation 2021;143(18)1754-1762.    After today's visit with the patient which was dedicated solely for shared decision making visit regarding LAA closure device, the patient decided to proceed with the LAA appendage closure procedure scheduled to be done in the near future at [facility]. Prior to the procedure, I would like to obtain a gated CT scan of the chest with contrast timed for PV/LA visualization.   Additionally, the patient will need an echo.  HAS-BLED score 3 Hypertension Yes  Abnormal renal and liver function (Dialysis, transplant, Cr >2.26 mg/dL /Cirrhosis or Bilirubin >2x Normal or AST/ALT/AP >3x Normal) No  Stroke No  Bleeding No  Labile INR (Unstable/high INR) No  Elderly (>65)  Yes  Drugs or alcohol (>= 8 drinks/week, anti-plt or NSAID) Yes   CHA2DS2-VASc Score = 3  The patient's score is based upon: CHF History: 0 HTN History: 1 Diabetes History: 0 Stroke History: 0 Vascular Disease History: 0 Age Score: 1 Gender Score: 1  I am also going to have her see the A-fib clinic next week to see if she is still out of rhythm.  If she remains out of rhythm, recommend cardioversion.  I am not in the schedule today as she has previously not had sustained episodes of atrial  fibrillation.   Signed, Leanora Prophet. Marven Slimmer, MD, [facility], [facility] 08/31/2023 9:10 AM    Electrophysiology [facility]

## 2023-08-31 ENCOUNTER — Other Ambulatory Visit: Payer: Self-pay

## 2023-08-31 ENCOUNTER — Encounter: Payer: Self-pay | Admitting: Cardiology

## 2023-08-31 ENCOUNTER — Ambulatory Visit: Attending: Cardiology | Admitting: Cardiology

## 2023-08-31 VITALS — BP 122/78 | HR 62 | Ht 66.0 in | Wt 183.6 lb

## 2023-08-31 DIAGNOSIS — I10 Essential (primary) hypertension: Secondary | ICD-10-CM | POA: Diagnosis not present

## 2023-08-31 DIAGNOSIS — I480 Paroxysmal atrial fibrillation: Secondary | ICD-10-CM

## 2023-08-31 MED ORDER — METOPROLOL SUCCINATE ER 25 MG PO TB24: 25.0000 mg | ORAL_TABLET | Freq: Two times a day (BID) | ORAL | 3 refills | Status: DC

## 2023-08-31 NOTE — Patient Instructions (Addendum)
 Medication Instructions:  Your physician has recommended you make the following change in your medication:  1) STOP taking metoprolol  tartrate  2) START taking metoprolol  succinate 25 mg twice daily   *If you need a refill on your cardiac medications before your next appointment, please call your pharmacy*  Lab Work: BMET and CBC - please have these done at any [facility] location within 30 days of your procedure  Testing/Procedures: Cardiac CT Your physician has requested that you have cardiac CT. Cardiac computed tomography (CT) is a painless test that uses an x-ray machine to take clear, detailed pictures of your heart. For further information please visit [email or web address] Please follow instruction sheet as given. You will be called to schedule this test.  Ablation  Your physician has recommended that you have an ablation. Catheter ablation is a medical procedure used to treat some cardiac arrhythmias (irregular heartbeats). During catheter ablation, a long, thin, flexible tube is put into a blood vessel in your groin (upper thigh), or neck. This tube is called an ablation catheter. It is then guided to your heart through the blood vessel. Radio frequency waves destroy small areas of heart tissue where abnormal heartbeats may cause an arrhythmia to start.    Watchman  Your physician has requested that you have Left atrial appendage (LAA) closure device implantation is a procedure to put a small device in the LAA of the heart. The LAA is a small sac in the wall of the heart's left upper chamber. Blood clots can form in this area. The device, Watchman closes the LAA to help prevent a blood clot and stroke.   You will be contacted by Nurse Navigator, Larkin Plumb to schedule your pre-procedure visit and procedure date. If you have any questions she can be reached at [phone].   Follow-Up: At [facility], you and your health needs are our priority.  As part of our continuing mission  to provide you with exceptional heart care, our providers are all part of one team.  This team includes your primary Cardiologist (physician) and Advanced Practice Providers or APPs (Physician Assistants and Nurse Practitioners) who all work together to provide you with the care you need, when you need it.  Follow up with AFIB Clinic in one week

## 2023-09-02 ENCOUNTER — Telehealth (HOSPITAL_COMMUNITY): Payer: Self-pay

## 2023-09-02 NOTE — Telephone Encounter (Signed)
 Received a message from [facility], PA-C that pt has a lot of questions about afib and her ablation. Patient was seen in office on 5/14 with discussion for concomitant ablation/watchman. Advised Larkin Plumb, RN was out of office.   Attempted to reach patient to address questions, no answer.

## 2023-09-05 DIAGNOSIS — I480 Paroxysmal atrial fibrillation: Secondary | ICD-10-CM

## 2023-09-05 NOTE — Telephone Encounter (Signed)
 Spoke with the patient at length earlier today. She thinks her atrial fibrillation is worsening. She asked if [facility] supplies a heart monitor watch and Kardia mobile or an Apple Watch was recommended. Advised to keep appointment with AFib Clinic on 5/22 and to seek emergency medical attention if her HR sustains over 120-130 bpm in the meantime.  While on the phone, concomitant ablation/Watchman was discussed. She understood if done separately, her afib could be treated sooner. Confirmed with Dr. Marven Slimmer they procedures may be separated.  Will route to the EP scheduler to call the patient and to offer soonest ablation availability.

## 2023-09-06 NOTE — Telephone Encounter (Signed)
 LM for pt to call back to schedule Ablation with Dr. Marven Slimmer.

## 2023-09-07 NOTE — Telephone Encounter (Signed)
 2nd attempt to contact pt to schedule Afib Ablation with Dr. Marven Slimmer. LM for pt to return my call. Left my direct number.

## 2023-09-08 ENCOUNTER — Ambulatory Visit (HOSPITAL_COMMUNITY)
Admission: RE | Admit: 2023-09-08 | Discharge: 2023-09-08 | Disposition: A | Discharge status: Home / Self Care | Source: Ambulatory Visit | Attending: Internal Medicine | Admitting: Internal Medicine

## 2023-09-08 ENCOUNTER — Other Ambulatory Visit (HOSPITAL_COMMUNITY): Payer: Self-pay

## 2023-09-08 VITALS — BP 128/82 | HR 56 | Ht 66.0 in | Wt 183.0 lb

## 2023-09-08 DIAGNOSIS — I480 Paroxysmal atrial fibrillation: Secondary | ICD-10-CM | POA: Diagnosis not present

## 2023-09-08 DIAGNOSIS — D6869 Other thrombophilia: Secondary | ICD-10-CM | POA: Diagnosis not present

## 2023-09-08 DIAGNOSIS — I4891 Unspecified atrial fibrillation: Secondary | ICD-10-CM

## 2023-09-08 NOTE — Telephone Encounter (Signed)
 I called and spoke to the patient and she said that she does not qualify for BMS. I advised her of the medicare payment plan. She asked if there was any other drug options instead. I ran some test claims and warfarin came back that it would be a 0.00 copay. She wanted me to send and ask if she would be a candidate for warfarin instead of eliquis . Xarelto is a 302.00 copay (same as eliquis ) and pradaxa is not covered covered on her plan. Thank you!

## 2023-09-08 NOTE — Telephone Encounter (Signed)
 Will route this message to pts Cardiologist and covering RN for further assistance with this.

## 2023-09-08 NOTE — Telephone Encounter (Signed)
 Monique Harrell

## 2023-09-08 NOTE — Progress Notes (Signed)
 Primary Care Physician: Forestine Igo, DO Primary Cardiologist: None Electrophysiologist: Boyce Byes, MD     Referring Physician: Dr. Sharie Dauphin Haleemah Buckalew is a 68 y.o. female with a history of HTN, HLD, hypothyroidism, and atrial fibrillation who presents for consultation in the [facility]rial Fibr[facility]. Seen by Dr. Marven Slimmer on 5/14 with plan to perform ablation and Watchman procedure; noted to be in Afib with RVR at OV. Patient is on Eliquis  5 mg BID for a CHADS2VASC score of 3.  On evaluation today, she is currently in NSR. Patient notes to be paroxysmal but last episode of Afib had a duration of 3 days which is longer than usual. She feels like she has been doing good the past couple of days maintaining normal rhythm.    Today, she denies symptoms of chest pain, shortness of breath, orthopnea, PND, lower extremity edema, dizziness, presyncope, syncope, snoring, daytime somnolence, bleeding, or neurologic sequela. The patient is tolerating medications without difficulties and is otherwise without complaint today.     she has a BMI of Body mass index is 29.54 kg/m.Aaron Aas Filed Weights   09/08/23 0901  Weight: 83 kg    Current Outpatient Medications  Medication Sig Dispense Refill   acetaminophen (TYLENOL) 500 MG tablet Take 500 mg by mouth as needed.     alendronate (FOSAMAX) 70 MG tablet Take 70 mg by mouth once a week.     amLODipine (NORVASC) 5 MG tablet Take 1 tablet (5 mg total) by mouth daily.     apixaban  (ELIQUIS ) 5 MG TABS tablet Take 1 tablet (5 mg total) by mouth 2 (two) times daily. 90 tablet 3   atorvastatin (LIPITOR) 40 MG tablet Take 40 mg by mouth daily.     Cholecalciferol (VITAMIN D-3 PO) Take 2,000 mcg by mouth every morning.     levothyroxine (SYNTHROID) 150 MCG tablet Take 150 mcg by mouth daily before breakfast.     metoprolol  succinate (TOPROL  XL) 25 MG 24 hr tablet Take 1 tablet (25 mg total) by mouth in the morning and at  bedtime. 180 tablet 3   Multiple Vitamins-Minerals (PRESERVISION AREDS 2 PO) Take 1 tablet by mouth every morning.     traMADol (ULTRAM) 50 MG tablet Take 50 mg by mouth as needed.     No current facility-administered medications for this encounter.    Atrial Fibrillation Management history:  Previous antiarrhythmic drugs: none Previous cardioversions: none Previous ablations: none Anticoagulation history: Eliquis    ROS- All systems are reviewed and negative except as per the HPI above.  Physical Exam: BP 128/82   Pulse (!) 56   Ht 5\' 6"  (1.676 m)   Wt 83 kg   BMI 29.54 kg/m   GEN: Well nourished, well developed in no acute distress NECK: No JVD; No carotid bruits CARDIAC: Regular rate and rhythm, no murmurs, rubs, gallops RESPIRATORY:  Clear to auscultation without rales, wheezing or rhonchi  ABDOMEN: Soft, non-tender, non-distended EXTREMITIES:  No edema; No deformity   EKG today demonstrates  Vent. rate 56 BPM PR interval 164 ms QRS duration 90 ms QT/QTcB 434/418 ms P-R-T axes 53 -1 35 Sinus bradycardia Otherwise normal ECG When compared with ECG of 28-Jun-2023 08:57, Sinus rhythm has replaced Afib  Echo scheduled for 09/26/23  ASSESSMENT & PLAN CHA2DS2-VASc Score = 3  The patient's score is based upon: CHF History: 0 HTN History: 1 Diabetes History: 0 Stroke History: 0 Vascular Disease History: 0 Age Score: 1  Gender Score: 1       ASSESSMENT AND PLAN: Paroxysmal Atrial Fibrillation (ICD10:  I48.0) The patient's CHA2DS2-VASc score is 3, indicating a 3.2% annual risk of stroke.    She is currently in NSR. We had a long discussion about rhythm control therapy which includes AAD therapy as a possible bridge to ablation. I did explain how some antiarrhythmic medications would require me to wait on her echocardiogram confirming normal function. I briefly discussed amiodarone as a bridge to ablation. After discussion, patient does not wish to begin AAD  therapy. She will continue as previously planned for ablation with Dr. Marven Slimmer. She will call office if she changes her mind.    Secondary Hypercoagulable State (ICD10:  D68.69) The patient is at significant risk for stroke/thromboembolism based upon her CHA2DS2-VASc Score of 3.  Continue Apixaban  (Eliquis ).  Continue Eliquis  5 mg BID without interruption.    Follow up Afib clinic prn.   Minnie Amber, PA-C  Afib Clinic [facility] [address] [place], [place] [address] [phone]

## 2023-09-08 NOTE — Telephone Encounter (Signed)
 Plan was to continue Eliquis  for short-term as she is undergoing watchman placement with Dr. Marven Slimmer in August.  If she does not wish to take Eliquis , warfarin is an option for her.  However she would have to have regular blood work checked to make sure she is therapeutic on the warfarin.  If this is her preference, we can refer her to Coumadin clinic

## 2023-09-08 NOTE — Telephone Encounter (Signed)
 Spoke with pt and schedule her Afib Ablation with Dr. Marven Slimmer on 8/11 at 12:30 PM.   She will have her Labs and CT done on 7/30...  I will send instruction letters via MyChart once completed.   Thanks, Bevin Das

## 2023-09-09 ENCOUNTER — Telehealth: Payer: Self-pay | Admitting: Cardiology

## 2023-09-09 NOTE — Telephone Encounter (Signed)
 Called and unable to leave message on patient's personal; cell, due to listed number being incorrect number.  Left message for patient to call office on daughter's cell phone

## 2023-09-09 NOTE — Telephone Encounter (Signed)
 Patient is returning call.

## 2023-09-09 NOTE — Telephone Encounter (Signed)
 Left message for patient to call back.  Not sure who called her earlier.

## 2023-09-09 NOTE — Telephone Encounter (Signed)
Pt returning nurses call from earlier. Please advise 

## 2023-09-09 NOTE — Telephone Encounter (Signed)
 Spoke with patient and she states she was looking at her mychart from [facility] and does not need further help

## 2023-09-15 NOTE — Telephone Encounter (Signed)
 Lmom for patient at [phone] for her to call me back to get her in touch with the office

## 2023-09-15 NOTE — Telephone Encounter (Signed)
 Pt is scheduled for 8/11 at 12:30 PM.

## 2023-09-20 NOTE — Telephone Encounter (Signed)
 Called and unable to reach patient , left message to call office .

## 2023-09-21 ENCOUNTER — Other Ambulatory Visit: Payer: Self-pay

## 2023-09-21 DIAGNOSIS — I480 Paroxysmal atrial fibrillation: Secondary | ICD-10-CM

## 2023-09-26 ENCOUNTER — Ambulatory Visit (HOSPITAL_COMMUNITY)

## 2023-09-26 NOTE — Telephone Encounter (Signed)
I called the patient and left her a message.

## 2023-09-28 ENCOUNTER — Telehealth: Payer: Self-pay

## 2023-09-28 NOTE — Telephone Encounter (Signed)
 Called and spoke to patient and patient verbalized she would like to continue Eliquis . Made patient aware that provider will be made aware. Made patient aware to call office for any other questions.

## 2023-09-28 NOTE — Telephone Encounter (Signed)
 Monique Harrell

## 2023-10-10 ENCOUNTER — Telehealth: Payer: Self-pay

## 2023-10-10 NOTE — Telephone Encounter (Signed)
 Monique Harrell

## 2023-10-21 LAB — BASIC METABOLIC PANEL WITH GFR
BUN/Creatinine Ratio: 19 (ref 12–28)
BUN: 17 mg/dL (ref 8–27)
CO2: 20 mmol/L (ref 20–29)
Calcium: 10 mg/dL (ref 8.7–10.3)
Chloride: 107 mmol/L — ABNORMAL HIGH (ref 96–106)
Creatinine, Ser: 0.9 mg/dL (ref 0.57–1.00)
Glucose: 95 mg/dL (ref 70–99)
Potassium: 4.8 mmol/L (ref 3.5–5.2)
Sodium: 143 mmol/L (ref 134–144)
eGFR: 69 mL/min/1.73 (ref >59)

## 2023-10-21 LAB — CBC
Hematocrit: 43.8 % (ref 34.0–46.6)
Hemoglobin: 13.8 g/dL (ref 11.1–15.9)
MCH: 29.7 pg (ref 26.6–33.0)
MCHC: 31.5 g/dL (ref 31.5–35.7)
MCV: 94 fL (ref 79–97)
Platelets: 207 x10E3/uL (ref 150–450)
RBC: 4.64 x10E6/uL (ref 3.77–5.28)
RDW: 12.9 % (ref 11.7–15.4)
WBC: 5.5 x10E3/uL (ref 3.4–10.8)

## 2023-11-01 ENCOUNTER — Encounter (HOSPITAL_COMMUNITY): Payer: Self-pay | Admitting: *Deleted

## 2023-11-01 ENCOUNTER — Encounter (HOSPITAL_COMMUNITY): Payer: Self-pay | Admitting: Radiology

## 2023-11-01 ENCOUNTER — Ambulatory Visit (HOSPITAL_COMMUNITY)
Admission: RE | Admit: 2023-11-01 | Discharge: 2023-11-01 | Disposition: A | Discharge status: Home / Self Care | Source: Ambulatory Visit | Attending: Internal Medicine | Admitting: Internal Medicine

## 2023-11-01 ENCOUNTER — Ambulatory Visit (HOSPITAL_BASED_OUTPATIENT_CLINIC_OR_DEPARTMENT_OTHER)
Admission: RE | Admit: 2023-11-01 | Discharge: 2023-11-01 | Disposition: A | Discharge status: Home / Self Care | Source: Ambulatory Visit | Attending: Cardiology | Admitting: Cardiology

## 2023-11-01 ENCOUNTER — Ambulatory Visit: Payer: Self-pay | Admitting: Cardiology

## 2023-11-01 DIAGNOSIS — I719 Aortic aneurysm of unspecified site, without rupture: Secondary | ICD-10-CM

## 2023-11-01 DIAGNOSIS — I480 Paroxysmal atrial fibrillation: Secondary | ICD-10-CM | POA: Insufficient documentation

## 2023-11-01 DIAGNOSIS — I4891 Unspecified atrial fibrillation: Secondary | ICD-10-CM | POA: Diagnosis not present

## 2023-11-01 LAB — ECHOCARDIOGRAM COMPLETE
AR max vel: 2.62 cm2
AV Area VTI: 2.95 cm2
AV Area mean vel: 2.86 cm2
AV Mean grad: 7.2 mmHg
AV Peak grad: 14.6 mmHg
Ao pk vel: 1.91 m/s
Area-P 1/2: 3 cm2
MV M vel: 4.08 m/s
MV Peak grad: 66.6 mmHg
Radius: 0.4 cm
S' Lateral: 2.9 cm

## 2023-11-01 MED ORDER — IOHEXOL 350 MG/ML SOLN
100.0000 mL | Freq: Once | INTRAVENOUS | Status: AC | PRN
  Administered 2023-11-01: 100 mL via INTRAVENOUS

## 2023-11-01 NOTE — Progress Notes (Signed)
 Documenting for Monique Harrell spoke with Dr. Loni about patient's echocardiogram.

## 2023-11-04 ENCOUNTER — Ambulatory Visit: Payer: Self-pay | Admitting: Cardiology

## 2023-11-04 NOTE — Telephone Encounter (Signed)
 Pt returning call, requesting cb

## 2023-11-07 ENCOUNTER — Telehealth: Payer: Self-pay

## 2023-11-07 NOTE — Telephone Encounter (Signed)
 Kameryn McDoland: Chicken wing anatomy Max 22/ AVG 20.5/ Depth 14.3 Likely use a 27mm device Very low and Ant TSP RAO 28 CAU 29

## 2023-11-09 ENCOUNTER — Other Ambulatory Visit: Payer: Self-pay | Admitting: *Deleted

## 2023-11-09 DIAGNOSIS — I719 Aortic aneurysm of unspecified site, without rupture: Secondary | ICD-10-CM

## 2023-11-09 NOTE — Progress Notes (Signed)
 Order placed for CTA of chest per Dr. Kate.

## 2023-11-14 ENCOUNTER — Encounter: Payer: Self-pay | Admitting: Cardiology

## 2023-11-14 NOTE — Progress Notes (Unsigned)
 Cardiology Office Note:    Date:  11/16/2023   ID:  Kharis, Lapenna [birth year], MRN [id number]  PCP:  Tobie Belton, DO   [facility] Providers Cardiologist:  [name], MD Cardiology APP:  Madie Jon Garre, PA  Electrophysiologist:  [name], MD     Referring MD: Tobie Belton, DO   Chief Complaint  Patient presents with   Aortic aneurysm without rupture, unspecified portion of aor    History of Present Illness:    Monique Harrell is a 68 y.o. female with a hx of coronary calcification, aortic aneurysm at 5 cm, HTN, HLD, and atrial fibrillation. He has followed with Dr. HOLTS for consideration of atrial fibrillation ablation with concomitant Watchman.  Workup included CTA coronary which revealed dilation of thoracic aorta to 5.0 cm without dissection and coronary calcification in left main and all 3 vessels.  Ablation and Watchman procedure was canceled and she is recommended for heart catheterization.  Placed on my schedule to set up cardiac catheterization.   She reports her main complaint is arthritis and joint pain since being off of meloxicam . She has had DOE and can't work in the heat, but no chest pain. She also reports lower extremity edema that was very severe since her cruise. She also has a URI from the trip - symptoms started yesterday.    Past Medical History:  Diagnosis Date   Thyroid  disease     Past Surgical History:  Procedure Laterality Date   ABDOMINAL HYSTERECTOMY     at age 41   BREAST BIOPSY Right    BREAST BIOPSY Left    JOINT REPLACEMENT Right    @ age 84 in [place]  Dr Mercer   thyroidectomy  2016    Current Medications: Current Meds  Medication Sig   acetaminophen (TYLENOL) 500 MG tablet Take 500 mg by mouth as needed.   alendronate (FOSAMAX) 70 MG tablet Take 70 mg by mouth once a week.   amLODipine (NORVASC) 5 MG tablet Take 1 tablet (5 mg total) by mouth daily.   apixaban  (ELIQUIS ) 5 MG TABS  tablet Take 1 tablet (5 mg total) by mouth 2 (two) times daily.   aspirin  EC 81 MG tablet Take 1 tablet (81 mg total) by mouth daily. Swallow whole.   atorvastatin  (LIPITOR) 80 MG tablet Take 1 tablet (80 mg total) by mouth daily.   Cholecalciferol (VITAMIN D-3 PO) Take 2,000 mcg by mouth every morning.   furosemide  (LASIX ) 20 MG tablet Take 1 tablet (20 mg total) by mouth daily. FOR ONLY 3 DAYS   levothyroxine (SYNTHROID) 150 MCG tablet Take 150 mcg by mouth daily before breakfast.   metoprolol  succinate (TOPROL  XL) 25 MG 24 hr tablet Take 1 tablet (25 mg total) by mouth in the morning and at bedtime.   Multiple Vitamins-Minerals (PRESERVISION AREDS 2 PO) Take 1 tablet by mouth every morning.   nitroGLYCERIN  (NITROSTAT ) 0.4 MG SL tablet Place 1 tablet (0.4 mg total) under the tongue every 5 (five) minutes as needed for chest pain.   traMADol (ULTRAM) 50 MG tablet Take 50 mg by mouth as needed.   [DISCONTINUED] atorvastatin  (LIPITOR) 40 MG tablet Take 40 mg by mouth daily.     Allergies:   Patient has no known allergies.   Social History   Socioeconomic History   Marital status: Single    Spouse name: Not on file   Number of children: Not on file   Years of education: Not  on file   Highest education level: Not on file  Occupational History   Not on file  Tobacco Use   Smoking status: Former    Current packs/day: 0.00    Types: Cigarettes    Quit date: 09/06/2008    Years since quitting: 15.2   Smokeless tobacco: Never  Vaping Use   Vaping status: Never Used  Substance and Sexual Activity   Alcohol use: Not Currently   Drug use: Never   Sexual activity: Not Currently  Other Topics Concern   Not on file  Social History Narrative   Not on file   Social Drivers of Health   Financial Resource Strain: Not on file  Food Insecurity: Not on file  Transportation Needs: Not on file  Physical Activity: Not on file  Stress: Not on file  Social Connections: Not on file      Family History: The patient's family history includes Breast cancer in her mother; Cancer in her brother, father, and mother.  ROS:   Please see the history of present illness.     All other systems reviewed and are negative.  EKGs/Labs/Other Studies Reviewed:    The following studies were reviewed today:  EKG Interpretation Date/Time:  Wednesday November 16 2023 14:06:54 EDT Ventricular Rate:  57 PR Interval:  164 QRS Duration:  92 QT Interval:  422 QTC Calculation: 410 R Axis:   -31  Text Interpretation: Sinus bradycardia Left axis deviation When compared with ECG of 08-Sep-2023 09:12, QRS axis Shifted left Confirmed by Madie Slough ([id number]) on 11/16/2023 2:14:40 PM    Recent Labs: 10/20/2023: BUN 17; Creatinine, Ser 0.90; Hemoglobin 13.8; Platelets 207; Potassium 4.8; Sodium 143  Recent Lipid Panel No results found for: CHOL, TRIG, HDL, CHOLHDL, VLDL, LDLCALC, LDLDIRECT   Risk Assessment/Calculations:    CHA2DS2-VASc Score = 3   This indicates a 3.2% annual risk of stroke. The patient's score is based upon: CHF History: 0 HTN History: 1 Diabetes History: 0 Stroke History: 0 Vascular Disease History: 0 Age Score: 1 Gender Score: 1            Physical Exam:    VS:  BP 121/75 (BP Location: Left Arm, Patient Position: Sitting, Cuff Size: Normal)   Pulse (!) 54   Resp 16   Ht 5' 6 (1.676 m)   Wt 186 lb 9.6 oz (84.6 kg)   SpO2 96%   BMI 30.12 kg/m     Wt Readings from Last 3 Encounters:  11/16/23 186 lb 9.6 oz (84.6 kg)  09/08/23 183 lb (83 kg)  08/31/23 183 lb 9.6 oz (83.3 kg)     GEN:  Well nourished, well developed in no acute distress HEENT: Normal NECK: No JVD; No carotid bruits LYMPHATICS: No lymphadenopathy CARDIAC: RRR, no murmurs, rubs, gallops RESPIRATORY:  Clear to auscultation without rales, wheezing or rhonchi  ABDOMEN: Soft, non-tender, non-distended MUSCULOSKELETAL:  No edema; No deformity  SKIN: Warm and dry NEUROLOGIC:   Alert and oriented x 3 PSYCHIATRIC:  Normal affect   ASSESSMENT:    1. Paroxysmal atrial fibrillation (HCC)   2. Atrial fibrillation, unspecified type (HCC)   3. Essential hypertension   4. Chronic anticoagulation   5. Hyperlipidemia with target low density lipoprotein (LDL) cholesterol less than 55 mg/dL   6. Aortic aneurysm without rupture, unspecified portion of aorta (HCC)   7. Tobacco abuse   8. Swelling    PLAN:    In order of problems listed above:  Coronary calcification - CAC  2592 at the 99th percentile with left main and three-vessel disease - plan for definitive angiography next week - will start ASA 81 mg, NTG PRN, continue BB   Thoracic aortic aneurysm - measured 5.0 cm on echo and CTA - heart catheterization planned as above, likely will need referral to CT surgery - continue BB - no lifting greater than 10 lbs   Hypertension - continue 5 mg amlodipine, 25 mg toprol  BID - BP well controlled   Hyperlipidemia with LDL goal < 70 - on 40 mg lipitor - will draw lipids and LPA - increase lipitor to 80 mg   Tobacco abuse - advised cessation   Follow up with Dr. Kate as scheduled.      Informed Consent   Shared Decision Making/Informed Consent The risks [stroke (1 in 1000), death (1 in 1000), kidney failure [usually temporary] (1 in 500), bleeding (1 in 200), allergic reaction [possibly serious] (1 in 200)], benefits (diagnostic support and management of coronary artery disease) and alternatives of a cardiac catheterization were discussed in detail with Ms. Jaros and she is willing to proceed.       Medication Adjustments/Labs and Tests Ordered: Current medicines are reviewed at length with the patient today.  Concerns regarding medicines are outlined above.  Orders Placed This Encounter  Procedures   CBC   HgB A1c   Direct LDL   Lipid Profile   Comp Met (CMET)   Lipoprotein A (LPA)   EKG 12-Lead   VAS US  LOWER EXTREMITY VENOUS (DVT)    Meds ordered this encounter  Medications   aspirin  EC 81 MG tablet    Sig: Take 1 tablet (81 mg total) by mouth daily. Swallow whole.   nitroGLYCERIN  (NITROSTAT ) 0.4 MG SL tablet    Sig: Place 1 tablet (0.4 mg total) under the tongue every 5 (five) minutes as needed for chest pain.    Dispense:  25 tablet    Refill:  5   furosemide  (LASIX ) 20 MG tablet    Sig: Take 1 tablet (20 mg total) by mouth daily. FOR ONLY 3 DAYS    Dispense:  3 tablet    Refill:  0   atorvastatin  (LIPITOR) 80 MG tablet    Sig: Take 1 tablet (80 mg total) by mouth daily.    Dispense:  90 tablet    Refill:  3    Patient Instructions  Medication Instructions:  Your physician has recommended you make the following change in your medication:  START ASPIRIN  81 MG DAILY START NITROGLYCERIN  0.4 MG SUBLINGUAL (UNDER THE TONGUE) AS NEEDED FOR CHEST PAIN. START LASIX  20 MG FOR 3 DAYS    INCREASE LIPITOR TO 80 MG DAILY  *If you need a refill on your cardiac medications before your next appointment, please call your pharmacy*  Lab Work: TODAY: CMET, CBC, A1C, LP(a), DIRECT LDL, LIPIDS If you have labs (blood work) drawn today and your tests are completely normal, you will receive your results only by: MyChart Message (if you have MyChart) OR A paper copy in the mail If you have any lab test that is abnormal or we need to change your treatment, we will call you to review the results.  Testing/Procedures: Your physician has requested that you have a cardiac catheterization. Cardiac catheterization is used to diagnose and/or treat various heart conditions. Doctors may recommend this procedure for a number of different reasons. The most common reason is to evaluate chest pain. Chest pain can be a symptom of coronary  artery disease (CAD), and cardiac catheterization can show whether plaque is narrowing or blocking your heart's arteries. This procedure is also used to evaluate the valves, as well as measure the blood flow  and oxygen levels in different parts of your heart. For further information please visit [email or web address] Please follow instruction sheet, as given.  Your physician has requested that you have a lower extremity venous duplex. This test is an ultrasound of the veins in the legs or arms. It looks at venous blood flow that carries blood from the heart to the legs or arms. Allow one hour for a Lower Venous exam. Allow thirty minutes for an Upper Venous exam. There are no restrictions or special instructions. TO BE DONE BEFORE CATH PROCEDURE   Please note: We ask at that you not bring children with you during ultrasound (echo/ vascular) testing. Due to room size and safety concerns, children are not allowed in the ultrasound rooms during exams. Our front office staff cannot provide observation of children in our lobby area while testing is being conducted. An adult accompanying a patient to their appointment will only be allowed in the ultrasound room at the discretion of the ultrasound technician under special circumstances. We apologize for any inconvenience.   Follow-Up: At [facility], you and your health needs are our priority.  As part of our continuing mission to provide you with exceptional heart care, our providers are all part of one team.  This team includes your primary Cardiologist (physician) and Advanced Practice Providers or APPs (Physician Assistants and Nurse Practitioners) who all work together to provide you with the care you need, when you need it.  Your next appointment:   KEEP FOLLOW-UP WITH DR. KATE ON 8/21 @ 3:40 PM  We recommend signing up for the patient portal called MyChart.  Sign up information is provided on this After Visit Summary.  MyChart is used to connect with patients for Virtual Visits (Telemedicine).  Patients are able to view lab/test results, encounter notes, upcoming appointments, etc.  Non-urgent messages can be sent to your provider as well.    To learn more about what you can do with MyChart, go to [email or web address].   Other Instructions  [facility] A DEPT OF [facility] [address] A DEPT OF THE [place] [facility] [address] [place] [address] Dept: [phone] Loc: [phone]  Dawnette Mione  11/16/2023  You are scheduled for a Cardiac Catheterization on Tuesday, August 5 with Dr. Peter [place].  1. Please arrive at the [facility] (Main Entrance A) at [facility]: [address] [place], [place] [address] at 10:00 AM (This time is 2 hour(s) before your procedure to ensure your preparation).   Free valet parking service is available. You will check in at ADMITTING. The support person will be asked to wait in the waiting room.  It is OK to have someone drop you off and come back when you are ready to be discharged.    Special note: Every effort is made to have your procedure done on time. Please understand that emergencies sometimes delay scheduled procedures.  2. Diet: No solid foods after midnight. You may have clear liquids until you leave for the hospital. (Light meal consist of plain toast, fruit, light soups, crackers)  3. Hydration: REGULAR: You need to be well hydrated before your procedure time. You may drink approved liquids (see below) until you leave for the hospital. On  the way to the hospital, please drink a 16-oz (1 plastic bottle) of water.      (List of approved liquids water, clear juice, clear tea, black coffee, fruit juices, non-citric and without pulp, carbonated beverages, Gatorade, Kool -Aid, plain Jello-O and plain ice popsicles)  4. Labs: You will need to have blood drawn on Wednesday, July 30 at [facility] (1st Floor), [address] [place], [place] [address]. You do not need to be fasting.  5. Medication instructions in preparation for your procedure:   Contrast Allergy:  No  Stop taking Eliquis  (Apixiban) on Sunday, August 3.  On the morning of your procedure, take your Aspirin  81 mg and any morning medicines NOT listed above.  You may use sips of water.  6. Plan to go home the same day, you will only stay overnight if medically necessary. 7. Bring a current list of your medications and current insurance cards. 8. You MUST have a responsible person to drive you home. 9. Someone MUST be with you the first 24 hours after you arrive home or your discharge will be delayed. 10. Please wear clothes that are easy to get on and off and wear slip-on shoes.  Thank you for allowing us  to care for you!   -- [facility] Invasive Cardiovascular services        Signed, Jon Nat Hails, [place]  11/16/2023 3:37 PM    [facility]

## 2023-11-14 NOTE — H&P (View-Only) (Signed)
 Cardiology Office Note:    Date:  11/16/2023   ID:  Kharis, Lapenna [birth year], MRN [id number]  PCP:  Tobie Belton, DO   [facility] Providers Cardiologist:  [name], MD Cardiology APP:  Madie Jon Garre, PA  Electrophysiologist:  [name], MD     Referring MD: Tobie Belton, DO   Chief Complaint  Patient presents with   Aortic aneurysm without rupture, unspecified portion of aor    History of Present Illness:    Monique Harrell is a 68 y.o. female with a hx of coronary calcification, aortic aneurysm at 5 cm, HTN, HLD, and atrial fibrillation. He has followed with Dr. HOLTS for consideration of atrial fibrillation ablation with concomitant Watchman.  Workup included CTA coronary which revealed dilation of thoracic aorta to 5.0 cm without dissection and coronary calcification in left main and all 3 vessels.  Ablation and Watchman procedure was canceled and she is recommended for heart catheterization.  Placed on my schedule to set up cardiac catheterization.   She reports her main complaint is arthritis and joint pain since being off of meloxicam . She has had DOE and can't work in the heat, but no chest pain. She also reports lower extremity edema that was very severe since her cruise. She also has a URI from the trip - symptoms started yesterday.    Past Medical History:  Diagnosis Date   Thyroid  disease     Past Surgical History:  Procedure Laterality Date   ABDOMINAL HYSTERECTOMY     at age 41   BREAST BIOPSY Right    BREAST BIOPSY Left    JOINT REPLACEMENT Right    @ age 84 in [place]  Dr Mercer   thyroidectomy  2016    Current Medications: Current Meds  Medication Sig   acetaminophen (TYLENOL) 500 MG tablet Take 500 mg by mouth as needed.   alendronate (FOSAMAX) 70 MG tablet Take 70 mg by mouth once a week.   amLODipine (NORVASC) 5 MG tablet Take 1 tablet (5 mg total) by mouth daily.   apixaban  (ELIQUIS ) 5 MG TABS  tablet Take 1 tablet (5 mg total) by mouth 2 (two) times daily.   aspirin  EC 81 MG tablet Take 1 tablet (81 mg total) by mouth daily. Swallow whole.   atorvastatin  (LIPITOR) 80 MG tablet Take 1 tablet (80 mg total) by mouth daily.   Cholecalciferol (VITAMIN D-3 PO) Take 2,000 mcg by mouth every morning.   furosemide  (LASIX ) 20 MG tablet Take 1 tablet (20 mg total) by mouth daily. FOR ONLY 3 DAYS   levothyroxine (SYNTHROID) 150 MCG tablet Take 150 mcg by mouth daily before breakfast.   metoprolol  succinate (TOPROL  XL) 25 MG 24 hr tablet Take 1 tablet (25 mg total) by mouth in the morning and at bedtime.   Multiple Vitamins-Minerals (PRESERVISION AREDS 2 PO) Take 1 tablet by mouth every morning.   nitroGLYCERIN  (NITROSTAT ) 0.4 MG SL tablet Place 1 tablet (0.4 mg total) under the tongue every 5 (five) minutes as needed for chest pain.   traMADol (ULTRAM) 50 MG tablet Take 50 mg by mouth as needed.   [DISCONTINUED] atorvastatin  (LIPITOR) 40 MG tablet Take 40 mg by mouth daily.     Allergies:   Patient has no known allergies.   Social History   Socioeconomic History   Marital status: Single    Spouse name: Not on file   Number of children: Not on file   Years of education: Not  on file   Highest education level: Not on file  Occupational History   Not on file  Tobacco Use   Smoking status: Former    Current packs/day: 0.00    Types: Cigarettes    Quit date: 09/06/2008    Years since quitting: 15.2   Smokeless tobacco: Never  Vaping Use   Vaping status: Never Used  Substance and Sexual Activity   Alcohol use: Not Currently   Drug use: Never   Sexual activity: Not Currently  Other Topics Concern   Not on file  Social History Narrative   Not on file   Social Drivers of Health   Financial Resource Strain: Not on file  Food Insecurity: Not on file  Transportation Needs: Not on file  Physical Activity: Not on file  Stress: Not on file  Social Connections: Not on file      Family History: The patient's family history includes Breast cancer in her mother; Cancer in her brother, father, and mother.  ROS:   Please see the history of present illness.     All other systems reviewed and are negative.  EKGs/Labs/Other Studies Reviewed:    The following studies were reviewed today:  EKG Interpretation Date/Time:  Wednesday November 16 2023 14:06:54 EDT Ventricular Rate:  57 PR Interval:  164 QRS Duration:  92 QT Interval:  422 QTC Calculation: 410 R Axis:   -31  Text Interpretation: Sinus bradycardia Left axis deviation When compared with ECG of 08-Sep-2023 09:12, QRS axis Shifted left Confirmed by Madie Slough ([id number]) on 11/16/2023 2:14:40 PM    Recent Labs: 10/20/2023: BUN 17; Creatinine, Ser 0.90; Hemoglobin 13.8; Platelets 207; Potassium 4.8; Sodium 143  Recent Lipid Panel No results found for: CHOL, TRIG, HDL, CHOLHDL, VLDL, LDLCALC, LDLDIRECT   Risk Assessment/Calculations:    CHA2DS2-VASc Score = 3   This indicates a 3.2% annual risk of stroke. The patient's score is based upon: CHF History: 0 HTN History: 1 Diabetes History: 0 Stroke History: 0 Vascular Disease History: 0 Age Score: 1 Gender Score: 1            Physical Exam:    VS:  BP 121/75 (BP Location: Left Arm, Patient Position: Sitting, Cuff Size: Normal)   Pulse (!) 54   Resp 16   Ht 5' 6 (1.676 m)   Wt 186 lb 9.6 oz (84.6 kg)   SpO2 96%   BMI 30.12 kg/m     Wt Readings from Last 3 Encounters:  11/16/23 186 lb 9.6 oz (84.6 kg)  09/08/23 183 lb (83 kg)  08/31/23 183 lb 9.6 oz (83.3 kg)     GEN:  Well nourished, well developed in no acute distress HEENT: Normal NECK: No JVD; No carotid bruits LYMPHATICS: No lymphadenopathy CARDIAC: RRR, no murmurs, rubs, gallops RESPIRATORY:  Clear to auscultation without rales, wheezing or rhonchi  ABDOMEN: Soft, non-tender, non-distended MUSCULOSKELETAL:  No edema; No deformity  SKIN: Warm and dry NEUROLOGIC:   Alert and oriented x 3 PSYCHIATRIC:  Normal affect   ASSESSMENT:    1. Paroxysmal atrial fibrillation (HCC)   2. Atrial fibrillation, unspecified type (HCC)   3. Essential hypertension   4. Chronic anticoagulation   5. Hyperlipidemia with target low density lipoprotein (LDL) cholesterol less than 55 mg/dL   6. Aortic aneurysm without rupture, unspecified portion of aorta (HCC)   7. Tobacco abuse   8. Swelling    PLAN:    In order of problems listed above:  Coronary calcification - CAC  2592 at the 99th percentile with left main and three-vessel disease - plan for definitive angiography next week - will start ASA 81 mg, NTG PRN, continue BB   Thoracic aortic aneurysm - measured 5.0 cm on echo and CTA - heart catheterization planned as above, likely will need referral to CT surgery - continue BB - no lifting greater than 10 lbs   Hypertension - continue 5 mg amlodipine, 25 mg toprol  BID - BP well controlled   Hyperlipidemia with LDL goal < 70 - on 40 mg lipitor - will draw lipids and LPA - increase lipitor to 80 mg   Tobacco abuse - advised cessation   Follow up with Dr. Kate as scheduled.      Informed Consent   Shared Decision Making/Informed Consent The risks [stroke (1 in 1000), death (1 in 1000), kidney failure [usually temporary] (1 in 500), bleeding (1 in 200), allergic reaction [possibly serious] (1 in 200)], benefits (diagnostic support and management of coronary artery disease) and alternatives of a cardiac catheterization were discussed in detail with Ms. Jaros and she is willing to proceed.       Medication Adjustments/Labs and Tests Ordered: Current medicines are reviewed at length with the patient today.  Concerns regarding medicines are outlined above.  Orders Placed This Encounter  Procedures   CBC   HgB A1c   Direct LDL   Lipid Profile   Comp Met (CMET)   Lipoprotein A (LPA)   EKG 12-Lead   VAS US  LOWER EXTREMITY VENOUS (DVT)    Meds ordered this encounter  Medications   aspirin  EC 81 MG tablet    Sig: Take 1 tablet (81 mg total) by mouth daily. Swallow whole.   nitroGLYCERIN  (NITROSTAT ) 0.4 MG SL tablet    Sig: Place 1 tablet (0.4 mg total) under the tongue every 5 (five) minutes as needed for chest pain.    Dispense:  25 tablet    Refill:  5   furosemide  (LASIX ) 20 MG tablet    Sig: Take 1 tablet (20 mg total) by mouth daily. FOR ONLY 3 DAYS    Dispense:  3 tablet    Refill:  0   atorvastatin  (LIPITOR) 80 MG tablet    Sig: Take 1 tablet (80 mg total) by mouth daily.    Dispense:  90 tablet    Refill:  3    Patient Instructions  Medication Instructions:  Your physician has recommended you make the following change in your medication:  START ASPIRIN  81 MG DAILY START NITROGLYCERIN  0.4 MG SUBLINGUAL (UNDER THE TONGUE) AS NEEDED FOR CHEST PAIN. START LASIX  20 MG FOR 3 DAYS    INCREASE LIPITOR TO 80 MG DAILY  *If you need a refill on your cardiac medications before your next appointment, please call your pharmacy*  Lab Work: TODAY: CMET, CBC, A1C, LP(a), DIRECT LDL, LIPIDS If you have labs (blood work) drawn today and your tests are completely normal, you will receive your results only by: MyChart Message (if you have MyChart) OR A paper copy in the mail If you have any lab test that is abnormal or we need to change your treatment, we will call you to review the results.  Testing/Procedures: Your physician has requested that you have a cardiac catheterization. Cardiac catheterization is used to diagnose and/or treat various heart conditions. Doctors may recommend this procedure for a number of different reasons. The most common reason is to evaluate chest pain. Chest pain can be a symptom of coronary  artery disease (CAD), and cardiac catheterization can show whether plaque is narrowing or blocking your heart's arteries. This procedure is also used to evaluate the valves, as well as measure the blood flow  and oxygen levels in different parts of your heart. For further information please visit [email or web address] Please follow instruction sheet, as given.  Your physician has requested that you have a lower extremity venous duplex. This test is an ultrasound of the veins in the legs or arms. It looks at venous blood flow that carries blood from the heart to the legs or arms. Allow one hour for a Lower Venous exam. Allow thirty minutes for an Upper Venous exam. There are no restrictions or special instructions. TO BE DONE BEFORE CATH PROCEDURE   Please note: We ask at that you not bring children with you during ultrasound (echo/ vascular) testing. Due to room size and safety concerns, children are not allowed in the ultrasound rooms during exams. Our front office staff cannot provide observation of children in our lobby area while testing is being conducted. An adult accompanying a patient to their appointment will only be allowed in the ultrasound room at the discretion of the ultrasound technician under special circumstances. We apologize for any inconvenience.   Follow-Up: At [facility], you and your health needs are our priority.  As part of our continuing mission to provide you with exceptional heart care, our providers are all part of one team.  This team includes your primary Cardiologist (physician) and Advanced Practice Providers or APPs (Physician Assistants and Nurse Practitioners) who all work together to provide you with the care you need, when you need it.  Your next appointment:   KEEP FOLLOW-UP WITH DR. KATE ON 8/21 @ 3:40 PM  We recommend signing up for the patient portal called MyChart.  Sign up information is provided on this After Visit Summary.  MyChart is used to connect with patients for Virtual Visits (Telemedicine).  Patients are able to view lab/test results, encounter notes, upcoming appointments, etc.  Non-urgent messages can be sent to your provider as well.    To learn more about what you can do with MyChart, go to [email or web address].   Other Instructions  [facility] A DEPT OF [facility] AT [address] A DEPT OF THE [facility] [address] [place] [address] Dept: [phone] Loc: [phone]  Dawnette Mione  11/16/2023  You are scheduled for a Cardiac Catheterization on Tuesday, August 5 with Dr. Peter Swaziland.  1. Please arrive at the [facility] (Main Entrance A) at [facility]: [address] [place], [place] [address] at 10:00 AM (This time is 2 hour(s) before your procedure to ensure your preparation).   Free valet parking service is available. You will check in at ADMITTING. The support person will be asked to wait in the waiting room.  It is OK to have someone drop you off and come back when you are ready to be discharged.    Special note: Every effort is made to have your procedure done on time. Please understand that emergencies sometimes delay scheduled procedures.  2. Diet: No solid foods after midnight. You may have clear liquids until you leave for the hospital. (Light meal consist of plain toast, fruit, light soups, crackers)  3. Hydration: REGULAR: You need to be well hydrated before your procedure time. You may drink approved liquids (see below) until you leave for the hospital. On  the way to the hospital, please drink a 16-oz (1 plastic bottle) of water.      (List of approved liquids water, clear juice, clear tea, black coffee, fruit juices, non-citric and without pulp, carbonated beverages, Gatorade, Kool -Aid, plain Jello-O and plain ice popsicles)  4. Labs: You will need to have blood drawn on Wednesday, July 30 at [facility] (1st Floor), [address] [place], [place] [address]. You do not need to be fasting.  5. Medication instructions in preparation for your procedure:   Contrast Allergy:  No  Stop taking Eliquis  (Apixiban) on Sunday, August 3.  On the morning of your procedure, take your Aspirin  81 mg and any morning medicines NOT listed above.  You may use sips of water.  6. Plan to go home the same day, you will only stay overnight if medically necessary. 7. Bring a current list of your medications and current insurance cards. 8. You MUST have a responsible person to drive you home. 9. Someone MUST be with you the first 24 hours after you arrive home or your discharge will be delayed. 10. Please wear clothes that are easy to get on and off and wear slip-on shoes.  Thank you for allowing us  to care for you!   -- [facility] Invasive Cardiovascular services        Signed, Jon Nat Hails, [place]  11/16/2023 3:37 PM    [facility]

## 2023-11-15 NOTE — Telephone Encounter (Signed)
 Agree with plan, would start by seeing PCP

## 2023-11-16 ENCOUNTER — Encounter: Payer: Self-pay | Admitting: Physician Assistant

## 2023-11-16 ENCOUNTER — Other Ambulatory Visit (HOSPITAL_COMMUNITY)

## 2023-11-16 ENCOUNTER — Ambulatory Visit: Attending: Physician Assistant | Admitting: Physician Assistant

## 2023-11-16 VITALS — BP 121/75 | HR 54 | Resp 16 | Ht 66.0 in | Wt 186.6 lb

## 2023-11-16 DIAGNOSIS — R609 Edema, unspecified: Secondary | ICD-10-CM

## 2023-11-16 DIAGNOSIS — E785 Hyperlipidemia, unspecified: Secondary | ICD-10-CM

## 2023-11-16 DIAGNOSIS — I10 Essential (primary) hypertension: Secondary | ICD-10-CM | POA: Diagnosis not present

## 2023-11-16 DIAGNOSIS — I4891 Unspecified atrial fibrillation: Secondary | ICD-10-CM | POA: Diagnosis not present

## 2023-11-16 DIAGNOSIS — Z7901 Long term (current) use of anticoagulants: Secondary | ICD-10-CM | POA: Diagnosis not present

## 2023-11-16 DIAGNOSIS — I480 Paroxysmal atrial fibrillation: Secondary | ICD-10-CM

## 2023-11-16 DIAGNOSIS — Z720 Tobacco use: Secondary | ICD-10-CM

## 2023-11-16 DIAGNOSIS — I719 Aortic aneurysm of unspecified site, without rupture: Secondary | ICD-10-CM

## 2023-11-16 LAB — LDL CHOLESTEROL, DIRECT

## 2023-11-16 MED ORDER — NITROGLYCERIN 0.4 MG SL SUBL: 0.4000 mg | SUBLINGUAL_TABLET | SUBLINGUAL | 5 refills | Status: AC | PRN

## 2023-11-16 MED ORDER — ASPIRIN 81 MG PO TBEC: 81.0000 mg | DELAYED_RELEASE_TABLET | Freq: Every day | ORAL | Status: DC

## 2023-11-16 MED ORDER — ATORVASTATIN CALCIUM 80 MG PO TABS: 80.0000 mg | ORAL_TABLET | Freq: Every day | ORAL | 3 refills | Status: AC

## 2023-11-16 MED ORDER — FUROSEMIDE 20 MG PO TABS: 20.0000 mg | ORAL_TABLET | Freq: Every day | ORAL | 0 refills | Status: DC

## 2023-11-16 NOTE — Patient Instructions (Signed)
 Medication Instructions:  Your physician has recommended you make the following change in your medication:  START ASPIRIN  81 MG DAILY START NITROGLYCERIN  0.4 MG SUBLINGUAL (UNDER THE TONGUE) AS NEEDED FOR CHEST PAIN. START LASIX  20 MG FOR 3 DAYS    INCREASE LIPITOR TO 80 MG DAILY  *If you need a refill on your cardiac medications before your next appointment, please call your pharmacy*  Lab Work: TODAY: CMET, CBC, A1C, LP(a), DIRECT LDL, LIPIDS If you have labs (blood work) drawn today and your tests are completely normal, you will receive your results only by: MyChart Message (if you have MyChart) OR A paper copy in the mail If you have any lab test that is abnormal or we need to change your treatment, we will call you to review the results.  Testing/Procedures: Your physician has requested that you have a cardiac catheterization. Cardiac catheterization is used to diagnose and/or treat various heart conditions. Doctors may recommend this procedure for a number of different reasons. The most common reason is to evaluate chest pain. Chest pain can be a symptom of coronary artery disease (CAD), and cardiac catheterization can show whether plaque is narrowing or blocking your heart's arteries. This procedure is also used to evaluate the valves, as well as measure the blood flow and oxygen levels in different parts of your heart. For further information please visit [email or web address] Please follow instruction sheet, as given.  Your physician has requested that you have a lower extremity venous duplex. This test is an ultrasound of the veins in the legs or arms. It looks at venous blood flow that carries blood from the heart to the legs or arms. Allow one hour for a Lower Venous exam. Allow thirty minutes for an Upper Venous exam. There are no restrictions or special instructions. TO BE DONE BEFORE CATH PROCEDURE   Please note: We ask at that you not bring children with you during ultrasound  (echo/ vascular) testing. Due to room size and safety concerns, children are not allowed in the ultrasound rooms during exams. Our front office staff cannot provide observation of children in our lobby area while testing is being conducted. An adult accompanying a patient to their appointment will only be allowed in the ultrasound room at the discretion of the ultrasound technician under special circumstances. We apologize for any inconvenience.   Follow-Up: At [facility], you and your health needs are our priority.  As part of our continuing mission to provide you with exceptional heart care, our providers are all part of one team.  This team includes your primary Cardiologist (physician) and Advanced Practice Providers or APPs (Physician Assistants and Nurse Practitioners) who all work together to provide you with the care you need, when you need it.  Your next appointment:   KEEP FOLLOW-UP WITH DR. KATE ON 8/21 @ 3:40 PM  We recommend signing up for the patient portal called MyChart.  Sign up information is provided on this After Visit Summary.  MyChart is used to connect with patients for Virtual Visits (Telemedicine).  Patients are able to view lab/test results, encounter notes, upcoming appointments, etc.  Non-urgent messages can be sent to your provider as well.   To learn more about what you can do with MyChart, go to [email or web address].   Other Instructions  [facility] A DEPT OF [place]ARTC[facility] AT [address] A DEPT OF THE [place] [address] [place] [address] Dept: [phone] Loc:  [phone]  Monique Harrell  11/16/2023  You are scheduled for a Cardiac Catheterization on Tuesday, August 5 with Dr. Peter Swaziland.  1. Please arrive at the [facility] (Main Entrance A) at [facility]: [address] [place], [place] [address] at 10:00 AM (This time is 2 hour(s) before your procedure to ensure  your preparation).   Free valet parking service is available. You will check in at ADMITTING. The support person will be asked to wait in the waiting room.  It is OK to have someone drop you off and come back when you are ready to be discharged.    Special note: Every effort is made to have your procedure done on time. Please understand that emergencies sometimes delay scheduled procedures.  2. Diet: No solid foods after midnight. You may have clear liquids until you leave for the hospital. (Light meal consist of plain toast, fruit, light soups, crackers)  3. Hydration: REGULAR: You need to be well hydrated before your procedure time. You may drink approved liquids (see below) until you leave for the hospital. On the way to the hospital, please drink a 16-oz (1 plastic bottle) of water.      (List of approved liquids water, clear juice, clear tea, black coffee, fruit juices, non-citric and without pulp, carbonated beverages, Gatorade, Kool -Aid, plain Jello-O and plain ice popsicles)  4. Labs: You will need to have blood drawn on Wednesday, July 30 at [facility] (1st Floor), [address], [place], [place] [address]. You do not need to be fasting.  5. Medication instructions in preparation for your procedure:   Contrast Allergy: No  Stop taking Eliquis  (Apixiban) on Sunday, August 3.  On the morning of your procedure, take your Aspirin  81 mg and any morning medicines NOT listed above.  You may use sips of water.  6. Plan to go home the same day, you will only stay overnight if medically necessary. 7. Bring a current list of your medications and current insurance cards. 8. You MUST have a responsible person to drive you home. 9. Someone MUST be with you the first 24 hours after you arrive home or your discharge will be delayed. 10. Please wear clothes that are easy to get on and off and wear slip-on shoes.  Thank you for allowing us  to care  for you!   -- [facility]

## 2023-11-17 ENCOUNTER — Ambulatory Visit: Payer: Self-pay | Admitting: Physician Assistant

## 2023-11-17 DIAGNOSIS — R7989 Other specified abnormal findings of blood chemistry: Secondary | ICD-10-CM

## 2023-11-17 LAB — COMPREHENSIVE METABOLIC PANEL WITH GFR
ALT: 66 IU/L — AB (ref 0–32)
AST: 48 IU/L — AB (ref 0–40)
Albumin: 4.6 g/dL (ref 3.9–4.9)
Alkaline Phosphatase: 135 IU/L — AB (ref 44–121)
BUN/Creatinine Ratio: 21 (ref 12–28)
BUN: 19 mg/dL (ref 8–27)
Bilirubin Total: 0.3 mg/dL (ref 0.0–1.2)
CO2: 19 mmol/L — AB (ref 20–29)
Calcium: 9.8 mg/dL (ref 8.7–10.3)
Chloride: 107 mmol/L — AB (ref 96–106)
Creatinine, Ser: 0.9 mg/dL (ref 0.57–1.00)
Globulin, Total: 2.2 g/dL (ref 1.5–4.5)
Glucose: 100 mg/dL — AB (ref 70–99)
Potassium: 4.2 mmol/L (ref 3.5–5.2)
Sodium: 140 mmol/L (ref 134–144)
Total Protein: 6.8 g/dL (ref 6.0–8.5)
eGFR: 69 mL/min/1.73 (ref >59)

## 2023-11-17 LAB — CBC
Hematocrit: 41.1 % (ref 34.0–46.6)
Hemoglobin: 13.5 g/dL (ref 11.1–15.9)
MCH: 31.3 pg (ref 26.6–33.0)
MCHC: 32.8 g/dL (ref 31.5–35.7)
MCV: 95 fL (ref 79–97)
Platelets: 175 x10E3/uL (ref 150–450)
RBC: 4.31 x10E6/uL (ref 3.77–5.28)
RDW: 13.1 % (ref 11.7–15.4)
WBC: 6.4 x10E3/uL (ref 3.4–10.8)

## 2023-11-17 LAB — LIPID PANEL
Chol/HDL Ratio: 3.4 ratio (ref 0.0–4.4)
Cholesterol, Total: 218 mg/dL — AB (ref 100–199)
HDL: 64 mg/dL (ref >39)
LDL Chol Calc (NIH): 117 mg/dL — AB (ref 0–99)
Triglycerides: 215 mg/dL — AB (ref 0–149)
VLDL Cholesterol Cal: 37 mg/dL (ref 5–40)

## 2023-11-17 LAB — LDL CHOLESTEROL, DIRECT: LDL Direct: 126 mg/dL — AB (ref 0–99)

## 2023-11-17 LAB — HEMOGLOBIN A1C
Est. average glucose Bld gHb Est-mCnc: 105 mg/dL
Hgb A1c MFr Bld: 5.3 (ref 4.8–5.6)

## 2023-11-18 ENCOUNTER — Ambulatory Visit (HOSPITAL_COMMUNITY)
Admission: RE | Admit: 2023-11-18 | Discharge: 2023-11-18 | Disposition: A | Discharge status: Home / Self Care | Source: Ambulatory Visit | Attending: Physician Assistant | Admitting: Physician Assistant

## 2023-11-18 ENCOUNTER — Encounter (HOSPITAL_COMMUNITY): Payer: Self-pay

## 2023-11-18 ENCOUNTER — Ambulatory Visit (HOSPITAL_COMMUNITY): Admit: 2023-11-18 | Admitting: Cardiology

## 2023-11-18 DIAGNOSIS — I719 Aortic aneurysm of unspecified site, without rupture: Secondary | ICD-10-CM | POA: Insufficient documentation

## 2023-11-18 DIAGNOSIS — R609 Edema, unspecified: Secondary | ICD-10-CM | POA: Insufficient documentation

## 2023-11-18 SURGERY — ATRIAL FIBRILLATION ABLATION
Anesthesia: General

## 2023-11-21 ENCOUNTER — Telehealth: Payer: Self-pay | Admitting: *Deleted

## 2023-11-21 NOTE — Telephone Encounter (Signed)
 Walterine Hila! I am covering for Angie while she is out. I would just recommend he come by and get it done as soon as he can. Thanks!  [employer]

## 2023-11-21 NOTE — Telephone Encounter (Addendum)
 Cardiac Catheterization scheduled at [facility] for: Tuesday November 22, 2023 12 Noon Arrival time [facility] Main Entrance A at: 10 AM  Diet: -May have light meal until 6 AM. (6 hours before procedure time) Approved light meal consists of plain toast, fruit, light soups, crackers.  Hydration: -May drink clear liquids until leaving for hospital. Approved liquids: Water , clear tea, black coffee, fruit juices-non-citric and without pulp,Gatorade, plain Jello/popsicles.  Drink 16 oz. bottle of water  on the way to the hospital.   Medication instructions: -Hold:  Eliquis -none 11/20/23 until post procedure  Pt tells me she is no longer taking Lasix . -Other usual morning medications can be taken including aspirin  81 mg.  Plan to go home the same day, you will only stay overnight if medically necessary.  You must have responsible adult to drive you home.  Someone must be with you the first 24 hours after you arrive home.  Reviewed procedure instructions with patient.

## 2023-11-21 NOTE — Telephone Encounter (Signed)
 Routing message to [employer] PA. Please advise on when Pt should receive hepatitis test.

## 2023-11-22 ENCOUNTER — Other Ambulatory Visit: Payer: Self-pay

## 2023-11-22 ENCOUNTER — Ambulatory Visit (HOSPITAL_COMMUNITY)
Admission: RE | Admit: 2023-11-22 | Discharge: 2023-11-22 | Disposition: A | Discharge status: Home / Self Care | Attending: Cardiology | Admitting: Cardiology

## 2023-11-22 ENCOUNTER — Ambulatory Visit (HOSPITAL_COMMUNITY)
Admission: RE | Disposition: A | Discharge status: Home / Self Care | Payer: Self-pay | Source: Home / Self Care | Attending: Cardiology

## 2023-11-22 DIAGNOSIS — I10 Essential (primary) hypertension: Secondary | ICD-10-CM | POA: Diagnosis not present

## 2023-11-22 DIAGNOSIS — I25118 Atherosclerotic heart disease of native coronary artery with other forms of angina pectoris: Secondary | ICD-10-CM

## 2023-11-22 DIAGNOSIS — Z87891 Personal history of nicotine dependence: Secondary | ICD-10-CM | POA: Diagnosis not present

## 2023-11-22 DIAGNOSIS — E785 Hyperlipidemia, unspecified: Secondary | ICD-10-CM | POA: Diagnosis not present

## 2023-11-22 DIAGNOSIS — I480 Paroxysmal atrial fibrillation: Secondary | ICD-10-CM | POA: Insufficient documentation

## 2023-11-22 DIAGNOSIS — I712 Thoracic aortic aneurysm, without rupture, unspecified: Secondary | ICD-10-CM | POA: Diagnosis not present

## 2023-11-22 DIAGNOSIS — Z7901 Long term (current) use of anticoagulants: Secondary | ICD-10-CM | POA: Diagnosis not present

## 2023-11-22 DIAGNOSIS — Z79899 Other long term (current) drug therapy: Secondary | ICD-10-CM | POA: Diagnosis not present

## 2023-11-22 HISTORY — PX: LEFT HEART CATH AND CORONARY ANGIOGRAPHY: CATH118249

## 2023-11-22 SURGERY — LEFT HEART CATH AND CORONARY ANGIOGRAPHY
Anesthesia: LOCAL

## 2023-11-22 MED ORDER — IOHEXOL 350 MG/ML SOLN
INTRAVENOUS | Status: DC | PRN
Start: 2023-11-22 — End: 2023-11-22
  Administered 2023-11-22: 45 mL

## 2023-11-22 MED ORDER — VERAPAMIL HCL 2.5 MG/ML IV SOLN
INTRAVENOUS | Status: DC | PRN
  Administered 2023-11-22: 10 mL via INTRA_ARTERIAL

## 2023-11-22 MED ORDER — FREE WATER: 500.0000 mL | Freq: Once | Status: DC

## 2023-11-22 MED ORDER — SODIUM CHLORIDE 0.9% FLUSH
3.0000 mL | INTRAVENOUS | Status: DC | PRN
Start: 2023-11-22 — End: 2023-11-22

## 2023-11-22 MED ORDER — SODIUM CHLORIDE 0.9% FLUSH: 3.0000 mL | INTRAVENOUS | Status: DC | PRN

## 2023-11-22 MED ORDER — ASPIRIN 81 MG PO CHEW: 81.0000 mg | CHEWABLE_TABLET | ORAL | Status: DC

## 2023-11-22 MED ORDER — FENTANYL CITRATE (PF) 100 MCG/2ML IJ SOLN
INTRAMUSCULAR | Status: DC | PRN
  Administered 2023-11-22: 25 ug via INTRAVENOUS

## 2023-11-22 MED ORDER — ACETAMINOPHEN 325 MG PO TABS: 650.0000 mg | ORAL_TABLET | ORAL | Status: DC | PRN

## 2023-11-22 MED ORDER — SODIUM CHLORIDE 0.9 % IV SOLN: 250.0000 mL | INTRAVENOUS | Status: DC | PRN

## 2023-11-22 MED ORDER — FENTANYL CITRATE (PF) 100 MCG/2ML IJ SOLN
INTRAMUSCULAR | Status: AC
Start: 2023-11-22 — End: 2023-11-22
  Filled 2023-11-22: qty 2

## 2023-11-22 MED ORDER — MIDAZOLAM HCL 2 MG/2ML IJ SOLN
INTRAMUSCULAR | Status: DC | PRN
  Administered 2023-11-22: 1 mg via INTRAVENOUS

## 2023-11-22 MED ORDER — LIDOCAINE HCL (PF) 1 % IJ SOLN
INTRAMUSCULAR | Status: DC | PRN
  Administered 2023-11-22: 5 mL via INTRADERMAL
  Administered 2023-11-22: 2 mL via INTRADERMAL

## 2023-11-22 MED ORDER — VERAPAMIL HCL 2.5 MG/ML IV SOLN
INTRAVENOUS | Status: AC
Start: 2023-11-22 — End: 2023-11-22
  Filled 2023-11-22: qty 2

## 2023-11-22 MED ORDER — ONDANSETRON HCL 4 MG/2ML IJ SOLN: 4.0000 mg | Freq: Four times a day (QID) | INTRAMUSCULAR | Status: DC | PRN

## 2023-11-22 MED ORDER — HYDRALAZINE HCL 20 MG/ML IJ SOLN: 10.0000 mg | INTRAMUSCULAR | Status: DC | PRN

## 2023-11-22 MED ORDER — MIDAZOLAM HCL 2 MG/2ML IJ SOLN
INTRAMUSCULAR | Status: AC
  Filled 2023-11-22: qty 2

## 2023-11-22 MED ORDER — SODIUM CHLORIDE 0.9% FLUSH: 3.0000 mL | Freq: Two times a day (BID) | INTRAVENOUS | Status: DC

## 2023-11-22 MED ORDER — LIDOCAINE HCL (PF) 1 % IJ SOLN
INTRAMUSCULAR | Status: AC
  Filled 2023-11-22: qty 30

## 2023-11-22 MED ORDER — HEPARIN SODIUM (PORCINE) 1000 UNIT/ML IJ SOLN
INTRAMUSCULAR | Status: AC
Start: 2023-11-22 — End: 2023-11-22
  Filled 2023-11-22: qty 10

## 2023-11-22 MED ORDER — HEPARIN (PORCINE) IN NACL 1000-0.9 UT/500ML-% IV SOLN
INTRAVENOUS | Status: DC | PRN
  Administered 2023-11-22: 1000 mL

## 2023-11-22 SURGICAL SUPPLY — 16 items
CATH 5FR JL3.5 JR4 ANG PIG MP (CATHETERS) IMPLANT
CATH INFINITI 5FR JL4 (CATHETERS) IMPLANT
CATH INFINITI 5FR JL5 (CATHETERS) IMPLANT
CLOSURE MYNX CONTROL 5F (Vascular Products) IMPLANT
DEVICE RAD COMP TR BAND LRG (VASCULAR PRODUCTS) IMPLANT
GLIDESHEATH SLEND SS 6F .021 (SHEATH) IMPLANT
GUIDEWIRE INQWIRE 1.5J.035X260 (WIRE) IMPLANT
KIT MICROPUNCTURE NIT STIFF (SHEATH) IMPLANT
PACK CARDIAC CATHETERIZATION (CUSTOM PROCEDURE TRAY) ×1 IMPLANT
SET ATX-X65L (MISCELLANEOUS) IMPLANT
SHEATH PINNACLE 5F 10CM (SHEATH) IMPLANT
SHEATH PROBE COVER 6X72 (BAG) IMPLANT
STATION PROTECTION PRESSURIZED (MISCELLANEOUS) IMPLANT
WIRE ASAHI PROWATER 300CM (WIRE) IMPLANT
WIRE EMERALD 3MM-J .035X150CM (WIRE) IMPLANT
WIRE HI TORQ VERSACORE-J 145CM (WIRE) IMPLANT

## 2023-11-22 NOTE — Progress Notes (Signed)
 Per PA Shane patient resume her Eliquis  at regular time tonight and she can stop taking baby aspirin .

## 2023-11-22 NOTE — Interval H&P Note (Signed)
 History and Physical Interval Note:  11/22/2023 10:38 AM  [facility]  has presented today for surgery, with the diagnosis of cad.  The various methods of treatment have been discussed with the patient and family. After consideration of risks, benefits and other options for treatment, the patient has consented to  Procedure(s): LEFT HEART CATH AND CORONARY ANGIOGRAPHY (N/A) as a surgical intervention.  The patient's history has been reviewed, patient examined, no change in status, stable for surgery.  I have reviewed the patient's chart and labs.  Questions were answered to the patient's satisfaction.   Cath Lab Visit (complete for each Cath Lab visit)  Clinical Evaluation Leading to the Procedure:   ACS: No.  Non-ACS:    Anginal Classification: CCS I  Anti-ischemic medical therapy: Maximal Therapy (2 or more classes of medications)  Non-Invasive Test Results: No non-invasive testing performed  Prior CABG: No previous CABG        [facility] 11/22/2023 10:38 AM

## 2023-11-22 NOTE — Discharge Instructions (Addendum)
 May resume Eliquis  this evening Drink plenty of fluids for 48 hours and keep wrist elevated at heart level for 24 hours  Radial Site Care   This sheet gives you information about how to care for yourself after your procedure. Your health care provider may also give you more specific instructions. If you have problems or questions, contact your health care provider. What can I expect after the procedure? After the procedure, it is common to have: Bruising and tenderness at the catheter insertion area. Follow these instructions at home: Medicines Take over-the-counter and prescription medicines only as told by your health care provider. Insertion site care Follow instructions from your health care provider about how to take care of your insertion site. Make sure you: Wash your hands with soap and water  before you change your bandage (dressing). If soap and water  are not available, use hand sanitizer. Remove your dressing as told by your health care provider. In 24 hours Check your insertion site every day for signs of infection. Check for: Redness, swelling, or pain. Fluid or blood. Pus or a bad smell. Warmth. Do not take baths, swim, or use a hot tub until your health care provider approves. You may shower 24-48 hours after the procedure, or as directed by your health care provider. Remove the dressing and gently wash the site with plain soap and water . Pat the area dry with a clean towel. Do not rub the site. That could cause bleeding. Do not apply powder or lotion to the site. Activity   For 24 hours after the procedure, or as directed by your health care provider: Do not flex or bend the affected arm. Do not push or pull heavy objects with the affected arm. Do not drive yourself home from the hospital or clinic. You may drive 24 hours after the procedure unless your health care provider tells you not to. Do not operate machinery or power tools. Do not lift anything that is  heavier than 10 lb (4.5 kg), or the limit that you are told, until your health care provider says that it is safe.  For 4 days Ask your health care provider when it is okay to: Return to work or school. Resume usual physical activities or sports. Resume sexual activity. General instructions If the catheter site starts to bleed, raise your arm and put firm pressure on the site. If the bleeding does not stop, get help right away. This is a medical emergency. If you went home on the same day as your procedure, a responsible adult should be with you for the first 24 hours after you arrive home. Keep all follow-up visits as told by your health care provider. This is important. Contact a health care provider if: You have a fever. You have redness, swelling, or yellow drainage around your insertion site. Get help right away if: You have unusual pain at the radial site. The catheter insertion area swells very fast. The insertion area is bleeding, and the bleeding does not stop when you hold steady pressure on the area. Your arm or hand becomes pale, cool, tingly, or numb. These symptoms may represent a serious problem that is an emergency. Do not wait to see if the symptoms will go away. Get medical help right away. Call your local emergency services (911 in the [place] Do not drive yourself to the hospital. Summary After the procedure, it is common to have bruising and tenderness at the site. Follow instructions from your health care provider about how  to take care of your radial site wound. Check the wound every day for signs of infection. Do not lift anything that is heavier than 10 lb (4.5 kg), or the limit that you are told, until your health care provider says that it is safe. This information is not intended to replace advice given to you by your health care provider. Make sure you discuss any questions you have with your health care provider. Document Revised: 05/11/2017 Document Reviewed:  05/11/2017 Elsevier Patient Education  2020 Else[employer] Inc[employer]  Femoral Site Care This sheet gives you information about how to care for yourself after your procedure. Your health care provider may also give you more specific instructions. If you have problems or questions, contact your health care provider. What can I expect after the procedure?  After the procedure, it is common to have: Bruising that usually fades within 1-2 weeks. Tenderness at the site. Follow these instructions at home: Wound care Follow instructions from your health care provider about how to take care of your insertion site. Make sure you: Wash your hands with soap and water  before you change your bandage (dressing). If soap and water  are not available, use hand sanitizer. Remove your dressing as told by your health care provider. In 24 hours Do not take baths, swim, or use a hot tub until your health care provider approves. You may shower 24-48 hours after the procedure or as told by your health care provider. Gently wash the site with plain soap and water . Pat the area dry with a clean towel. Do not rub the site. This may cause bleeding. Do not apply powder or lotion to the site. Keep the site clean and dry. Check your femoral site every day for signs of infection. Check for: Redness, swelling, or pain. Fluid or blood. Warmth. Pus or a bad smell. Activity For the first 2-3 days after your procedure, or as long as directed: Avoid climbing stairs as much as possible. Do not squat. Do not lift anything that is heavier than 10 lb (4.5 kg), or the limit that you are told, until your health care provider says that it is safe. For 5 days Rest as directed. Avoid sitting for a long time without moving. Get up to take short walks every 1-2 hours. Do not drive for 24 hours if you were given a medicine to help you relax (sedative). General instructions Take over-the-counter and prescription medicines only as told by your  health care provider. Keep all follow-up visits as told by your health care provider. This is important. Contact a health care provider if you have: A fever or chills. You have redness, swelling, or pain around your insertion site. Get help right away if: The catheter insertion area swells very fast. You pass out. You suddenly start to sweat or your skin gets clammy. The catheter insertion area is bleeding, and the bleeding does not stop when you hold steady pressure on the area. The area near or just beyond the catheter insertion site becomes pale, cool, tingly, or numb. These symptoms may represent a serious problem that is an emergency. Do not wait to see if the symptoms will go away. Get medical help right away. Call your local emergency services (911 in the [place] Do not drive yourself to the hospital. Summary After the procedure, it is common to have bruising that usually fades within 1-2 weeks. Check your femoral site every day for signs of infection. Do not lift anything that is heavier than 10  lb (4.5 kg), or the limit that you are told, until your health care provider says that it is safe. This information is not intended to replace advice given to you by your health care provider. Make sure you discuss any questions you have with your health care provider. Document Revised: 04/18/2017 Document Reviewed: 04/18/2017 Elsevier Patient Education  2020 [employer].

## 2023-11-23 ENCOUNTER — Encounter (HOSPITAL_COMMUNITY): Payer: Self-pay | Admitting: Cardiology

## 2023-11-23 ENCOUNTER — Encounter: Payer: Self-pay | Admitting: Cardiology

## 2023-11-24 MED FILL — Heparin Sodium (Porcine) Inj 1000 Unit/ML: INTRAMUSCULAR | Qty: 10 | Status: AC

## 2023-11-25 LAB — HEPB+HEPC+HIV PANEL
HIV Screen 4th Generation wRfx: NONREACTIVE
Hep B C IgM: NEGATIVE
Hep B Core Total Ab: NEGATIVE
Hep B E Ab: NONREACTIVE
Hep B E Ag: NEGATIVE
Hep B Surface Ab, Qual: NONREACTIVE
Hep C Virus Ab: NONREACTIVE
Hepatitis B Surface Ag: NEGATIVE

## 2023-11-28 ENCOUNTER — Encounter: Payer: Self-pay | Admitting: Cardiology

## 2023-11-28 NOTE — Telephone Encounter (Signed)
 Called and spoke to pt regarding the grape size area in her Right Groin. Had Heart Cath on 11/22/23; Radial approach failed, and Right Groin was accessed. Pt states she does have about a 18 in long bruise/hematoma to right leg. Today, she noticed a grape sized lump in the right groin. It is not painful, there is no drainage, no fever, no warm to touch. It is the color of her skin (normal in color).   Advised pt to present to the nearest Emergency Room if this grape sized area becomes larger in the next 24 hours. There are no APP slots available and pt does not wish to go to the [place] location. She has an OV with Dr. Kate on 12/08/23. Also advised pt that she can call [phone] at any time to speak to a Triage nurse or a provider after hours. Pt verbalized understanding.

## 2023-11-29 NOTE — Telephone Encounter (Signed)
 Recommend arterial duplex of R groin to makes sure no pseudoaneurysm

## 2023-12-05 ENCOUNTER — Other Ambulatory Visit: Payer: Self-pay | Admitting: *Deleted

## 2023-12-05 DIAGNOSIS — I729 Aneurysm of unspecified site: Secondary | ICD-10-CM

## 2023-12-05 NOTE — Progress Notes (Signed)
 Made patient aware that Dr. Kate recommends arterial duplex of right groin to evaluate pseudoaneurysm.Order placed

## 2023-12-07 ENCOUNTER — Ambulatory Visit (HOSPITAL_COMMUNITY)
Admission: RE | Admit: 2023-12-07 | Discharge: 2023-12-07 | Disposition: A | Discharge status: Home / Self Care | Source: Ambulatory Visit | Attending: Cardiology | Admitting: Cardiology

## 2023-12-07 ENCOUNTER — Ambulatory Visit: Payer: Self-pay | Admitting: Cardiology

## 2023-12-07 DIAGNOSIS — I729 Aneurysm of unspecified site: Secondary | ICD-10-CM | POA: Diagnosis present

## 2023-12-07 DIAGNOSIS — M79651 Pain in right thigh: Secondary | ICD-10-CM | POA: Diagnosis not present

## 2023-12-07 NOTE — Progress Notes (Unsigned)
 Cardiology Office Note:    Date:  12/08/2023   ID:  Monique Harrell, Monique Harrell Monique Harrell, MRN [id number]  PCP:  [name], MD  Cardiologist:  [name], MD  Electrophysiologist:  [name], MD   Referring MD: Tobie Belton, DO   Chief Complaint  Patient presents with   Atrial Fibrillation    History of Present Illness:    Monique Harrell is a 68 y.o. female with a hx of hypertension, hyperlipidemia, hypothyroidism who presents for follow-up.  She was referred by Dr. Maree for evaluation of atrial fibrillation, initially seen 08/18/2023  Echocardiogram 03/09/2022: EF 55 to 60%, mild LVH.  Zio patch x 14 days 07/2023 showed 90 episodes of SVT, longest lasting 17 seconds with average rate 143 bpm, 2% atrial fibrillation burden with average rate 130 bpm and longest episode lasting 5 hours.  Echocardiogram 11/01/2023 showed aortic aneurysm measuring 50 mm, normal biventricular function, no significant valvular disease.  Coronary calcium  score 2592 (99th percentile).  LHC 11/22/2023 showed 75% mid LAD stenosis, 50% D2 stenosis.  Medical management recommended as no active angina.  Reported significant swelling at right femoral access site and underwent arterial duplex which showed hematoma but no evidence of pseudoaneurysm or AV fistula.  Since last clinic visit, she reports she is doing okay.  States that had an episode of A-fib over the last week, felt palpitations.  Resolved 2 days ago.  She denies any chest pain but does report some dyspnea.  Reports some lightheadedness but denies any syncope.  Reports having some lower extremity edema.  She quit smoking.  She is taking Eliquis , denies any bleeding.  Reports hematoma in right groin from recent cardiac catheterization has resolved.  Past Medical History:  Diagnosis Date   Thyroid  disease     Past Surgical History:  Procedure Laterality Date   ABDOMINAL HYSTERECTOMY     at age 68   BREAST BIOPSY Right     BREAST BIOPSY Left    JOINT REPLACEMENT Right    @ age 35 in [place]  Dr Mercer   LEFT HEART CATH AND CORONARY ANGIOGRAPHY N/A 11/22/2023   Procedure: LEFT HEART CATH AND CORONARY ANGIOGRAPHY;  Surgeon: Swaziland, Peter M, MD;  Location: [facility] INVASIVE CV LAB;  Service: Cardiovascular;  Laterality: N/A;   thyroidectomy  2016    Current Medications: Current Meds  Medication Sig   alendronate (FOSAMAX) 70 MG tablet Take 70 mg by mouth once a week.   amLODipine (NORVASC) 5 MG tablet Take 1 tablet (5 mg total) by mouth daily.   apixaban  (ELIQUIS ) 5 MG TABS tablet Take 1 tablet (5 mg total) by mouth 2 (two) times daily.   atorvastatin  (LIPITOR) 80 MG tablet Take 1 tablet (80 mg total) by mouth daily.   Cholecalciferol (VITAMIN D-3 PO) Take 2,000 mcg by mouth every morning.   furosemide  (LASIX ) 20 MG tablet Take 1 tablet (20 mg total) by mouth daily. FOR ONLY 3 DAYS   levothyroxine (SYNTHROID) 150 MCG tablet Take 150 mcg by mouth daily before breakfast.   metoprolol  succinate (TOPROL  XL) 25 MG 24 hr tablet Take 1 tablet (25 mg total) by mouth in the morning and at bedtime.   Multiple Vitamins-Minerals (PRESERVISION AREDS 2 PO) Take 1 tablet by mouth every morning.   nitroGLYCERIN  (NITROSTAT ) 0.4 MG SL tablet Place 1 tablet (0.4 mg total) under the tongue every 5 (five) minutes as needed for chest pain.   traMADol (ULTRAM) 50 MG tablet Take 50 mg  by mouth every 6 (six) hours as needed for moderate pain (pain score 4-6).   [DISCONTINUED] aspirin  EC 81 MG tablet Take 1 tablet (81 mg total) by mouth daily. Swallow whole.     Allergies:   Patient has no known allergies.   Social History   Socioeconomic History   Marital status: Single    Spouse name: Not on file   Number of children: Not on file   Years of education: Not on file   Highest education level: Not on file  Occupational History   Not on file  Tobacco Use   Smoking status: Former    Current packs/day: 0.00    Types: Cigarettes    Quit  date: 09/06/2008    Years since quitting: 15.2   Smokeless tobacco: Never  Vaping Use   Vaping status: Never Used  Substance and Sexual Activity   Alcohol use: Not Currently   Drug use: Never   Sexual activity: Not Currently  Other Topics Concern   Not on file  Social History Narrative   Not on file   Social Drivers of Health   Financial Resource Strain: Not on file  Food Insecurity: Not on file  Transportation Needs: Not on file  Physical Activity: Not on file  Stress: Not on file  Social Connections: Not on file     Family History: The patient's family history includes Breast cancer in her mother; Cancer in her brother, father, and mother.  ROS:   Please see the history of present illness.     All other systems reviewed and are negative.  EKGs/Labs/Other Studies Reviewed:    The following studies were reviewed today:   EKG:  06/28/2023: Atrial fibrillation, rate 140  Recent Labs: 11/16/2023: ALT 66; BUN 19; Creatinine, Ser 0.90; Hemoglobin 13.5; Platelets 175; Potassium 4.2; Sodium 140  Recent Lipid Panel    Component Value Date/Time   CHOL 218 (H) 11/16/2023 1538   TRIG 215 (H) 11/16/2023 1538   HDL 64 11/16/2023 1538   CHOLHDL 3.4 11/16/2023 1538   LDLCALC 117 (H) 11/16/2023 1538   LDLDIRECT 126 (H) 11/16/2023 1538    Physical Exam:    VS:  BP 116/72 (BP Location: Left Arm, Patient Position: Sitting, Cuff Size: Normal)   Pulse (!) 56   Ht 5' 6 (1.676 m)   Wt 187 lb 3.2 oz (84.9 kg)   SpO2 97%   BMI 30.21 kg/m     Wt Readings from Last 3 Encounters:  12/08/23 187 lb 3.2 oz (84.9 kg)  11/22/23 184 lb (83.5 kg)  11/16/23 186 lb 9.6 oz (84.6 kg)     GEN:  in no acute distress HEENT: Normal NECK: No JVD; No carotid bruits CARDIAC: RRR, no murmurs, rubs, gallops RESPIRATORY:  Clear to auscultation without rales, wheezing or rhonchi  ABDOMEN: Soft, non-tender, non-distended MUSCULOSKELETAL:  No edema; No deformity  SKIN: Warm and dry NEUROLOGIC:   Alert and oriented x 3 PSYCHIATRIC:  Normal affect   ASSESSMENT:    1. Paroxysmal atrial fibrillation (HCC)   2. Aortic aneurysm without rupture, unspecified portion of aorta (HCC)   3. Coronary artery disease involving native coronary artery of native heart without angina pectoris   4. Essential hypertension   5. Hyperlipidemia, unspecified hyperlipidemia type   6. Snoring     PLAN:    Atrial fibrillation: Zio patch x 14 days 07/2023 showed 90 episodes of SVT, longest lasting 17 seconds with average rate 143 bpm, 2% atrial fibrillation burden with  average rate 130 bpm and longest episode lasting 5 hours.  CHA2DS2-VASc 3 (hypertension, age, female). Echocardiogram 03/09/2022: EF 55 to 60%, mild LVH. Echocardiogram 11/01/2023 showed aortic aneurysm measuring 50 mm, normal biventricular function, no significant valvular disease. - Continue Eliquis  5 mg twice daily.  She had been taking daily meloxicam  for arthritis, discussed that would not recommend taking daily NSAIDs while on Eliquis  and instead recommend Tylenol  for pain.  She is willing to try this for short-term but does not wish to take long-term anticoagulation.  Referred to EP, planning ablation/Watchman - Continue metoprolol  25 mg twice daily - Sleep study  Ascending aortic aneurysm: Measured 50mm on echocardiogram and CTA.  Will order f/u CTA in 6 months and refer to CT surgery  CAD: Coronary calcium  score 2592 (99th percentile).  LHC 11/22/2023 showed 75% mid LAD stenosis, 50% D2 stenosis.  Medical management recommended as no active angina.  Reported significant swelling at right femoral access site and underwent arterial duplex which showed hematoma but no evidence of pseudoaneurysm or AV fistula. -Continue Eliquis .  Can stop aspirin  -Continue Toprol -XL -Continue atorvastatin  80 mg daily  Hypertension: On amlodipine 5 mg daily and Toprol -XL 25 mg daily.  Appears controlled  Hyperlipidemia: On atorvastatin  40 daily, LDL 117 on  11/16/2023.  Atorvastatin  increased to 80 mg daily for goal LDL less than 70.  LFTs mildly elevated, will repeat labs  Snoring/daytime somnolence: Check Itamar sleep study.  STOP-BANG 4  Tobacco use: Congratulated patient on quitting smoking and encouraged continued cessation  RTC in 3 months  Medication Adjustments/Labs and Tests Ordered: Current medicines are reviewed at length with the patient today.  Concerns regarding medicines are outlined above.  Orders Placed This Encounter  Procedures   CT ANGIO CHEST AORTA W/CM & OR WO/CM   Comprehensive Metabolic Panel (CMET)   Ambulatory referral to Cardiothoracic Surgery   No orders of the defined types were placed in this encounter.   Patient Instructions  Medication Instructions:  Continue current medications *If you need a refill on your cardiac medications before your next appointment, please call your pharmacy*  Lab Work: cmet If you have labs (blood work) drawn today and your tests are completely normal, you will receive your results only by: MyChart Message (if you have MyChart) OR A paper copy in the mail If you have any lab test that is abnormal or we need to change your treatment, we will call you to review the results.  Testing/Procedures: Someone will reach out to you about starting your Itamar  Follow-Up: At [facility], you and your health needs are our priority.  As part of our continuing mission to provide you with exceptional heart care, our providers are all part of one team.  This team includes your primary Cardiologist (physician) and Advanced Practice Providers or APPs (Physician Assistants and Nurse Practitioners) who all work together to provide you with the care you need, when you need it.  Your next appointment:   3 month(s)  Provider:   [name], MD    We recommend signing up for the patient portal called MyChart.  Sign up information is provided on this After Visit Summary.   MyChart is used to connect with patients for Virtual Visits (Telemedicine).  Patients are able to view lab/test results, encounter notes, upcoming appointments, etc.  Non-urgent messages can be sent to your provider as well.   To learn more about what you can do with MyChart, go to [email or web address].   Other Instructions  none       Signed, [name], MD  12/08/2023 5:37 PM    [facility]

## 2023-12-08 ENCOUNTER — Telehealth: Payer: Self-pay | Admitting: *Deleted

## 2023-12-08 ENCOUNTER — Ambulatory Visit: Attending: Cardiology | Admitting: Cardiology

## 2023-12-08 ENCOUNTER — Encounter: Payer: Self-pay | Admitting: Cardiology

## 2023-12-08 VITALS — BP 116/72 | HR 56 | Ht 66.0 in | Wt 187.2 lb

## 2023-12-08 DIAGNOSIS — R0683 Snoring: Secondary | ICD-10-CM

## 2023-12-08 DIAGNOSIS — I2510 Atherosclerotic heart disease of native coronary artery without angina pectoris: Secondary | ICD-10-CM | POA: Diagnosis not present

## 2023-12-08 DIAGNOSIS — I719 Aortic aneurysm of unspecified site, without rupture: Secondary | ICD-10-CM | POA: Diagnosis not present

## 2023-12-08 DIAGNOSIS — E785 Hyperlipidemia, unspecified: Secondary | ICD-10-CM

## 2023-12-08 DIAGNOSIS — I480 Paroxysmal atrial fibrillation: Secondary | ICD-10-CM

## 2023-12-08 DIAGNOSIS — I10 Essential (primary) hypertension: Secondary | ICD-10-CM | POA: Diagnosis not present

## 2023-12-08 NOTE — Telephone Encounter (Signed)
 Ordering provider: DR KATE Associated diagnoses: R40.0 WatchPAT PA obtained on 12/08/2023 by Joshua Dalton Seip, CMA. Authorization: APPROVED-Prior authorization is NOT REQUIRED  Patient notified of PIN (1234) on 12/08/2023 via Notification Method: phone.

## 2023-12-08 NOTE — Patient Instructions (Signed)
 Medication Instructions:  Continue current medications *If you need a refill on your cardiac medications before your next appointment, please call your pharmacy*  Lab Work: cmet If you have labs (blood work) drawn today and your tests are completely normal, you will receive your results only by: MyChart Message (if you have MyChart) OR A paper copy in the mail If you have any lab test that is abnormal or we need to change your treatment, we will call you to review the results.  Testing/Procedures: Someone will reach out to you about starting your Itamar  Follow-Up: At [facility], you and your health needs are our priority.  As part of our continuing mission to provide you with exceptional heart care, our providers are all part of one team.  This team includes your primary Cardiologist (physician) and Advanced Practice Providers or APPs (Physician Assistants and Nurse Practitioners) who all work together to provide you with the care you need, when you need it.  Your next appointment:   3 month(s)  Provider:   [name], MD    We recommend signing up for the patient portal called MyChart.  Sign up information is provided on this After Visit Summary.  MyChart is used to connect with patients for Virtual Visits (Telemedicine).  Patients are able to view lab/test results, encounter notes, upcoming appointments, etc.  Non-urgent messages can be sent to your provider as well.   To learn more about what you can do with MyChart, go to [email or web address].   Other Instructions none

## 2023-12-10 LAB — COMPREHENSIVE METABOLIC PANEL WITH GFR
ALT: 25 IU/L (ref 0–32)
AST: 19 IU/L (ref 0–40)
Albumin: 4.3 g/dL (ref 3.9–4.9)
Alkaline Phosphatase: 121 IU/L (ref 44–121)
BUN/Creatinine Ratio: 21 (ref 12–28)
BUN: 18 mg/dL (ref 8–27)
Bilirubin Total: 0.4 mg/dL (ref 0.0–1.2)
CO2: 20 mmol/L (ref 20–29)
Calcium: 9.7 mg/dL (ref 8.7–10.3)
Chloride: 107 mmol/L — ABNORMAL HIGH (ref 96–106)
Creatinine, Ser: 0.87 mg/dL (ref 0.57–1.00)
Globulin, Total: 2 g/dL (ref 1.5–4.5)
Glucose: 81 mg/dL (ref 70–99)
Potassium: 4.1 mmol/L (ref 3.5–5.2)
Sodium: 143 mmol/L (ref 134–144)
Total Protein: 6.3 g/dL (ref 6.0–8.5)
eGFR: 72 mL/min/1.73 (ref >59)

## 2023-12-11 ENCOUNTER — Ambulatory Visit: Payer: Self-pay | Admitting: Cardiology

## 2023-12-21 ENCOUNTER — Encounter (HOSPITAL_BASED_OUTPATIENT_CLINIC_OR_DEPARTMENT_OTHER): Payer: Self-pay | Admitting: Cardiology

## 2023-12-21 ENCOUNTER — Other Ambulatory Visit: Payer: Self-pay

## 2023-12-21 DIAGNOSIS — I480 Paroxysmal atrial fibrillation: Secondary | ICD-10-CM

## 2023-12-22 ENCOUNTER — Ambulatory Visit: Attending: Cardiology

## 2023-12-22 DIAGNOSIS — R0683 Snoring: Secondary | ICD-10-CM

## 2023-12-22 DIAGNOSIS — R400 Somnolence: Secondary | ICD-10-CM

## 2023-12-22 NOTE — Procedures (Signed)
   SLEEP STUDY REPORT Patient Information Study Date: 12/21/2023 Patient Name: Monique Harrell Patient ID: [id number] Birth Date: Feb 23, 1956 Age: 68 Gender: Female BMI: 30.5 (W=189 lb, H=5' 6'') Stopbang: 4 Referring Physician: Lonni Nanas, MD  TEST DESCRIPTION: Home sleep apnea testing was completed using the WatchPat, a Type 1 device, utilizing  peripheral arterial tonometry (PAT), chest movement, actigraphy, pulse oximetry, pulse rate, body position and snore.  AHI was calculated with apnea and hypopnea using valid sleep time as the denominator. RDI includes apneas,  hypopneas, and RERAs. The data acquired and the scoring of sleep and all associated events were performed in  accordance with the recommended standards and specifications as outlined in the AASM Manual for the Scoring of  Sleep and Associated Events 2.2.0 (2015).   FINDINGS: 1. No evidence of Obstructive Sleep Apnea with AHI 2.8/hr.  2. No Central Sleep Apnea. 3. Oxygen desaturations as low as 89%. 4. Moderate to severe snoring was present. O2 sats were < 88% for 0 minutes. 5. Total sleep time was 5 hrs and 47 min. 6. 89% of total sleep time was spent in REM sleep.  7. Normal sleep onset latency at 15 min.  8. Shortened REM sleep onset latency at 83 min.  9. Total awakenings were 9 .   DIAGNOSIS:  Normal study with no significant sleep disordered breathing.  RECOMMENDATIONS: 1. Normal study with no significant sleep disordered breathing. 2. Healthy sleep recommendations include: adequate nightly sleep (normal 7-9 hrs/night), avoidance of caffeine after  noon and alcohol near bedtime, and maintaining a sleep environment that is cool, dark and quiet. 3. Weight loss for overweight patients is recommended.  4. Snoring recommendations include: weight loss where appropriate, side sleeping, and avoidance of alcohol before  bed. 5. Operation of motor vehicle or dangerous equipment must be avoided when feeling  drowsy, excessively sleepy, or  mentally fatigued.  6. An ENT consultation which may be useful for specific causes of and possible treatment of bothersome snoring .  7. Weight loss may be of benefit in reducing the severity of snoring.   Signature: Wilbert Bihari, MD; [facility]; Diplomat, American Board of Sleep  Medicine Electronically Signed: 12/22/2023 8:27:55 AM

## 2023-12-26 DIAGNOSIS — I480 Paroxysmal atrial fibrillation: Secondary | ICD-10-CM

## 2023-12-26 NOTE — Telephone Encounter (Signed)
 See 12/26/2023 MyChart messages.

## 2023-12-27 NOTE — Addendum Note (Signed)
 Addended by: Nyles Mitton A on: 12/27/2023 12:41 PM   Modules accepted: Orders

## 2023-12-30 ENCOUNTER — Telehealth: Payer: Self-pay | Admitting: *Deleted

## 2023-12-30 NOTE — Telephone Encounter (Signed)
 The patient has been notified of the result and verbalized understanding.  All questions (if any) were answered. Joshua Dalton Seip, CMA 12/30/2023 5:30 PM

## 2023-12-30 NOTE — Telephone Encounter (Signed)
-----   Message from Wilbert Bihari sent at 12/22/2023  8:29 AM EDT ----- Please let patient know that sleep study showed no significant sleep apnea.

## 2024-01-05 ENCOUNTER — Ambulatory Visit (HOSPITAL_COMMUNITY)
Admission: RE | Admit: 2024-01-05 | Discharge: 2024-01-05 | Disposition: A | Discharge status: Home / Self Care | Source: Ambulatory Visit | Attending: Family Medicine | Admitting: Family Medicine

## 2024-01-05 DIAGNOSIS — I719 Aortic aneurysm of unspecified site, without rupture: Secondary | ICD-10-CM

## 2024-01-05 DIAGNOSIS — I7121 Aneurysm of the ascending aorta, without rupture: Secondary | ICD-10-CM | POA: Diagnosis present

## 2024-01-05 MED ORDER — IOHEXOL 350 MG/ML SOLN
75.0000 mL | Freq: Once | INTRAVENOUS | Status: AC | PRN
  Administered 2024-01-05: 75 mL via INTRAVENOUS

## 2024-01-06 LAB — CBC WITH DIFFERENTIAL/PLATELET
Basophils Absolute: 0 x10E3/uL (ref 0.0–0.2)
Basos: 1 %
EOS (ABSOLUTE): 0.1 x10E3/uL (ref 0.0–0.4)
Eos: 2 %
Hematocrit: 41 % (ref 34.0–46.6)
Hemoglobin: 13.4 g/dL (ref 11.1–15.9)
Immature Grans (Abs): 0 x10E3/uL (ref 0.0–0.1)
Immature Granulocytes: 0 %
Lymphocytes Absolute: 1 x10E3/uL (ref 0.7–3.1)
Lymphs: 18 %
MCH: 30.9 pg (ref 26.6–33.0)
MCHC: 32.7 g/dL (ref 31.5–35.7)
MCV: 95 fL (ref 79–97)
Monocytes Absolute: 0.4 x10E3/uL (ref 0.1–0.9)
Monocytes: 7 %
Neutrophils Absolute: 4.1 x10E3/uL (ref 1.4–7.0)
Neutrophils: 72 %
Platelets: 207 x10E3/uL (ref 150–450)
RBC: 4.33 x10E6/uL (ref 3.77–5.28)
RDW: 12.7 % (ref 11.7–15.4)
WBC: 5.6 x10E3/uL (ref 3.4–10.8)

## 2024-01-06 LAB — BASIC METABOLIC PANEL WITH GFR
BUN/Creatinine Ratio: 18 (ref 12–28)
BUN: 14 mg/dL (ref 8–27)
CO2: 19 mmol/L — ABNORMAL LOW (ref 20–29)
Calcium: 9.4 mg/dL (ref 8.7–10.3)
Chloride: 103 mmol/L (ref 96–106)
Creatinine, Ser: 0.8 mg/dL (ref 0.57–1.00)
Glucose: 71 mg/dL (ref 70–99)
Potassium: 4.4 mmol/L (ref 3.5–5.2)
Sodium: 140 mmol/L (ref 134–144)
eGFR: 79 mL/min/1.73 (ref >59)

## 2024-01-11 ENCOUNTER — Ambulatory Visit: Payer: Self-pay

## 2024-01-12 ENCOUNTER — Ambulatory Visit

## 2024-01-12 VITALS — BP 139/76 | HR 47 | Resp 20 | Ht 66.0 in | Wt 186.0 lb

## 2024-01-12 DIAGNOSIS — I7121 Aneurysm of the ascending aorta, without rupture: Secondary | ICD-10-CM

## 2024-01-12 NOTE — Progress Notes (Addendum)
 [address] [place] [address]             [phone]            Monique Harrell [id number] [birth year]   History of Present Illness:  Monique Harrell is a 68 year old female with medical history of paroxysmal atrial fibrillation, hypertension, hyperlipidemia, CAD and arthritis who presents for initial encounter of ascending thoracic aortic aneurysm. Aneurysm was initially found on echocardiogram on 11/01/2023 when she was being evaluated for a watchman procedure for her atrial fibrillation.  Echocardiogram measured aortic aneurysm at 5.0 cm and showed tricuspid aortic valve. CTA of chest on 01/05/2024 measured aneurysm at 4.8 cm.  She also underwent left heart catheterization which showed 75% mid LAD stenosis, 50% D2 stenosis which is going to managed medically since no active angina.   She states that she has been doing well.  She has been experiencing more pain due to her arthritis recently.  Her blood pressure is well controlled with current medication therapy.  She does yard work and is active. She does notice some shortness of breath with exertion.  She denies chest pain and lower leg swelling.   Current Outpatient Medications on File Prior to Visit  Medication Sig Dispense Refill   alendronate (FOSAMAX) 70 MG tablet Take 70 mg by mouth once a week.     amLODipine (NORVASC) 5 MG tablet Take 1 tablet (5 mg total) by mouth daily.     apixaban  (ELIQUIS ) 5 MG TABS tablet Take 1 tablet (5 mg total) by mouth 2 (two) times daily. 90 tablet 3   atorvastatin  (LIPITOR) 80 MG tablet Take 1 tablet (80 mg total) by mouth daily. 90 tablet 3   Cholecalciferol (VITAMIN D-3 PO) Take 2,000 mcg by mouth every morning.     levothyroxine (SYNTHROID) 150 MCG tablet Take 150 mcg by mouth daily before breakfast.     metoprolol  succinate (TOPROL  XL) 25 MG 24 hr tablet Take 1 tablet (25 mg total) by mouth in the morning and at bedtime. 180 tablet 3   Multiple  Vitamins-Minerals (PRESERVISION AREDS 2 PO) Take 1 tablet by mouth every morning.     nitroGLYCERIN  (NITROSTAT ) 0.4 MG SL tablet Place 1 tablet (0.4 mg total) under the tongue every 5 (five) minutes as needed for chest pain. 25 tablet 5   traMADol (ULTRAM) 50 MG tablet Take 50 mg by mouth every 6 (six) hours as needed for moderate pain (pain score 4-6).     No current facility-administered medications on file prior to visit.     ROS: Review of Systems  Constitutional:  Negative for fever and malaise/fatigue.  Respiratory:  Positive for shortness of breath.        On exertion  Cardiovascular:  Negative for chest pain and leg swelling.  Musculoskeletal:  Positive for joint pain.  Neurological:  Negative for headaches.     BP 139/76   Pulse (!) 47   Resp 20   Ht 5' 6 (1.676 m)   Wt 186 lb (84.4 kg)   SpO2 95% Comment: RA  BMI 30.02 kg/m   Physical Exam Constitutional:      Appearance: Normal appearance.  HENT:     Head: Normocephalic and atraumatic.  Cardiovascular:     Rate and Rhythm: Normal rate and regular rhythm.     Heart sounds: Normal heart sounds, S1 normal and S2 normal.  Pulmonary:  Effort: Pulmonary effort is normal.     Breath sounds: Normal breath sounds.  Musculoskeletal:     Cervical back: Normal range of motion.  Skin:    General: Skin is warm and dry.  Neurological:     General: No focal deficit present.     Mental Status: She is alert.      Imaging:  ECHOCARDIOGRAM REPORT       Patient Name:   Monique Harrell [facility] Date of Exam: 11/01/2023  Medical Rec #:  [id number]             Height:       66.0 in  Accession #:    [id number]            Weight:       183.0 lb  Date of Birth:  Nov 04, 1955             BSA:          1.926 m  Patient Age:    68 years              BP:           150/87 mmHg  Patient Gender: F                     HR:           38 bpm.  Exam Location:  [address]   Procedure: 2D Echo, 3D Echo, Cardiac Doppler, Color Doppler  and Strain  Analysis            (Both Spectral and Color Flow Doppler were utilized during             procedure).   Indications:    I48.91 Atrial Fibrillation    History:        Patient has no prior history of Echocardiogram  examinations.                 Signs/Symptoms:Palpitations, Dizziness/Lightheadedness and                  Shortness of Breath; Risk Factors:Hypertension and HLD.    Sonographer:    Waldo Guadalajara RCS  Referring Phys: [id number] CHRISTOPHER L SCHUMANN   IMPRESSIONS     1. Aortic dilatation noted. Aneurysm of the ascending aorta, measuring 50  mm. There is severe dilatation of the ascending aorta.   2. Left ventricular ejection fraction, by estimation, is 60 to 65%. Left  ventricular ejection fraction by 3D volume is 68 %. The left ventricle has  normal function. The left ventricle has no regional wall motion  abnormalities. There is moderate left  ventricular hypertrophy. Left ventricular diastolic parameters were  grossly normal. The average left ventricular global longitudinal strain is  -21.6 %. The global longitudinal strain is normal.   3. Right ventricular systolic function is normal. The right ventricular  size is normal. There is normal pulmonary artery systolic pressure. The  estimated right ventricular systolic pressure is 25.8 mmHg.   4. Left atrial size was mild to moderately dilated.   5. Right atrial size was mildly dilated.   6. The mitral valve is grossly normal. Trivial mitral valve  regurgitation. No evidence of mitral stenosis.   7. The aortic valve is tricuspid. Aortic valve regurgitation is not  visualized. No aortic stenosis is present.   8. The inferior vena cava is normal in size with greater than 50%  respiratory variability, suggesting right atrial pressure of 3 mmHg.  9. Increased flow velocities may be secondary to anemia, thyrotoxicosis,  hyperdynamic or high flow state.   FINDINGS   Left Ventricle: Left ventricular ejection  fraction, by estimation, is 60  to 65%. Left ventricular ejection fraction by 3D volume is 68 %. The left  ventricle has normal function. The left ventricle has no regional wall  motion abnormalities. The average  left ventricular global longitudinal strain is -21.6 %. Strain was  performed and the global longitudinal strain is normal. The left  ventricular internal cavity size was normal in size. There is moderate  left ventricular hypertrophy. Left ventricular  diastolic parameters were normal.   Right Ventricle: The right ventricular size is normal. No increase in  right ventricular wall thickness. Right ventricular systolic function is  normal. There is normal pulmonary artery systolic pressure. The tricuspid  regurgitant velocity is 2.39 m/s, and   with an assumed right atrial pressure of 3 mmHg, the estimated right  ventricular systolic pressure is 25.8 mmHg.   Left Atrium: Left atrial size was mild to moderately dilated.   Right Atrium: Right atrial size was mildly dilated.   Pericardium: There is no evidence of pericardial effusion.   Mitral Valve: The mitral valve is grossly normal. Trivial mitral valve  regurgitation. No evidence of mitral valve stenosis.   Tricuspid Valve: The tricuspid valve is normal in structure. Tricuspid  valve regurgitation is trivial. No evidence of tricuspid stenosis.   Aortic Valve: The aortic valve is tricuspid. Aortic valve regurgitation is  not visualized. No aortic stenosis is present. Aortic valve mean gradient  measures 7.2 mmHg. Aortic valve peak gradient measures 14.6 mmHg. Aortic  valve area, by VTI measures 2.95   cm.   Pulmonic Valve: The pulmonic valve was normal in structure. Pulmonic valve  regurgitation is trivial. No evidence of pulmonic stenosis.   Aorta: Aortic dilatation noted and the aortic root is normal in size and  structure. There is severe dilatation of the ascending aorta. There is an  aneurysm involving the  ascending aorta measuring 50 mm.   Venous: The inferior vena cava is normal in size with greater than 50%  respiratory variability, suggesting right atrial pressure of 3 mmHg.   IAS/Shunts: There is redundancy of the interatrial septum. No atrial level  shunt detected by color flow Doppler.   Additional Comments: 3D was performed not requiring image post processing  on an independent workstation and was normal.     LEFT VENTRICLE  PLAX 2D  LVIDd:         4.30 cm         Diastology  LVIDs:         2.90 cm         LV e' medial:    7.29 cm/s  LV PW:         1.20 cm         LV E/e' medial:  11.3  LV IVS:        1.50 cm         LV e' lateral:   11.50 cm/s  LVOT diam:     2.00 cm         LV E/e' lateral: 7.2  LV SV:         139  LV SV Index:   72              2D Longitudinal  LVOT Area:     3.14 cm  Strain                                 2D Strain GLS   -19.9 %                                 (A4C):                                 2D Strain GLS   -22.0 %                                 (A3C):                                 2D Strain GLS   -22.9 %                                 (A2C):                                 2D Strain GLS   -21.6 %                                 Avg:                                   3D Volume EF                                 LV 3D EF:    Left                                              ventricul                                              ar                                              ejection                                              fraction                                              by 3D  volume is                                              68 %.                                   3D Volume EF:                                 3D EF:        68 %                                 LV EDV:       75 ml                                 LV ESV:       24 ml                                 LV SV:         51 ml   RIGHT VENTRICLE  RV Basal diam:  3.40 cm  RV S prime:     13.90 cm/s  TAPSE (M-mode): 2.5 cm  RVSP:           25.8 mmHg   LEFT ATRIUM             Index        RIGHT ATRIUM           Index  LA diam:        3.90 cm 2.03 cm/m   RA Pressure: 3.00 mmHg  LA Vol (A2C):   61.1 ml 31.73 ml/m  RA Area:     18.70 cm  LA Vol (A4C):   43.3 ml 22.49 ml/m  RA Volume:   57.60 ml  29.91 ml/m  LA Biplane Vol: 54.9 ml 28.51 ml/m   AORTIC VALVE  AV Area (Vmax):    2.62 cm  AV Area (Vmean):   2.86 cm  AV Area (VTI):     2.95 cm  AV Vmax:           190.96 cm/s  AV Vmean:          124.170 cm/s  AV VTI:            0.472 m  AV Peak Grad:      14.6 mmHg  AV Mean Grad:      7.2 mmHg  LVOT Vmax:         159.00 cm/s  LVOT Vmean:        113.000 cm/s  LVOT VTI:          0.443 m  LVOT/AV VTI ratio: 0.94    AORTA  Ao Root diam: 3.30 cm  Ao Asc diam:  5.00 cm   MITRAL VALVE                  TRICUSPID VALVE  MV Area (PHT):  TR Peak grad:   22.8 mmHg  MV Decel Time:                TR Vmax:        239.00 cm/s  MR Peak grad:    66.6 mmHg    Estimated RAP:  3.00 mmHg  MR Mean grad:    46.0 mmHg    RVSP:           25.8 mmHg  MR Vmax:         408.00 cm/s  MR Vmean:        321.0 cm/s   SHUNTS  MR PISA:         1.01 cm     Systemic VTI:  0.44 m  MR PISA Eff ROA: 8 mm        Systemic Diam: 2.00 cm  MR PISA Radius:  0.40 cm  MV E velocity: 82.30 cm/s  MV A velocity: 65.00 cm/s  MV E/A ratio:  1.27   Soyla Merck MD  Electronically signed by Soyla Merck MD  Signature Date/Time: 11/01/2023/3:29:47 PM  CLINICAL DATA:  Aortic aneurysm     EXAM: CT ANGIOGRAPHY CHEST WITH CONTRAST   TECHNIQUE: Multidetector CT imaging of the chest was performed using the standard protocol during bolus administration of intravenous contrast. Multiplanar CT image reconstructions and MIPs were obtained to evaluate the vascular anatomy.   RADIATION DOSE REDUCTION: This exam was  performed according to the departmental dose-optimization program which includes automated exposure control, adjustment of the mA and/or kV according to patient size and/or use of iterative reconstruction technique.   CONTRAST:  75mL OMNIPAQUE  IOHEXOL  350 MG/ML SOLN   COMPARISON:  11/01/2023.   FINDINGS: Cardiovascular: Preferential opacification of the thoracic aorta. Ascending thoracic aortic aneurysm with a diameter up to 4.8 cm. No dissection. Normal heart size. No pericardial effusion.   Mediastinum/Nodes: No enlarged mediastinal, hilar, or axillary lymph nodes. Thyroid  gland, trachea, and esophagus demonstrate no significant findings.   Lungs/Pleura: Minimal dependent basilar subsegmental atelectasis. Lungs are otherwise clear. No pleural effusion or pneumothorax.   Upper Abdomen: No acute abnormality.   Musculoskeletal: No chest wall abnormality. No acute or significant osseous findings.   Review of the MIP images confirms the above findings.   IMPRESSION: 1. Ascending thoracic aortic aneurysm with a diameter up to 4.8 cm. No dissection. 2. No acute cardiopulmonary process.     Electronically Signed   By: Fonda Field M.D.   On: 01/06/2024 10:06     A/P: Aneurysm of ascending aorta without rupture -4.8 cm ascending thoracic aortic aneurysm on CTA of chest. Echocardiogram measured aortic aneurysm at 5.0 cm and showed tricuspid aortic valve. We discussed the natural history and and risk factors for growth of ascending aortic aneurysms. Discussed recommendations to minimize the risk of further expansion or dissection including careful blood pressure control, avoidance of contact sports and heavy lifting, attention to lipid management.  We covered the importance of continued smoking cessation.  The patient does not yet meet surgical criteria of >5.5cm. The patient is aware of signs and symptoms of aortic dissection and when to present to the emergency department    -Follow up in 6 months with CTA of chest    Risk Modification:  Statin:  atorvastatin   Smoking cessation instruction/counseling given:  commended patient for quitting and reviewed strategies for preventing relapses  Patient was counseled on importance of Blood Pressure Control  They are instructed to contact their Primary Care Physician if  they start to have blood pressure readings over 130s/90s. Do not ever stop blood pressure medications on your own, unless instructed by healthcare professional.  Please avoid use of Fluoroquinolones as this can potentially increase your risk of Aortic Rupture and/or Dissection  Patient educated on signs and symptoms of Aortic Dissection, handout also provided in AVS  Monique CHRISTELLA Rough, PA-C 01/12/24

## 2024-01-12 NOTE — Patient Instructions (Signed)

## 2024-01-20 ENCOUNTER — Telehealth: Payer: Self-pay

## 2024-01-20 NOTE — Telephone Encounter (Signed)
 Confirmed procedure date of 01/26/2024. Confirmed arrival time of 1015 for procedure time at 1245. Reviewed pre-procedure instructions with patient. Confirmed she has no contrast allergy. Confirmed no PPM or defibrillator. The patient understands to call if questions/concerns arise prior to procedure.  She was grateful for call and agreed with plan.

## 2024-01-26 ENCOUNTER — Encounter (HOSPITAL_COMMUNITY)
Admission: RE | Disposition: A | Discharge status: Home / Self Care | Payer: Self-pay | Source: Home / Self Care | Attending: Cardiology

## 2024-01-26 ENCOUNTER — Other Ambulatory Visit: Payer: Self-pay

## 2024-01-26 ENCOUNTER — Inpatient Hospital Stay (HOSPITAL_COMMUNITY): Admitting: Anesthesiology

## 2024-01-26 ENCOUNTER — Encounter (HOSPITAL_COMMUNITY): Payer: Self-pay | Admitting: Cardiology

## 2024-01-26 ENCOUNTER — Inpatient Hospital Stay (HOSPITAL_COMMUNITY)

## 2024-01-26 ENCOUNTER — Inpatient Hospital Stay (HOSPITAL_COMMUNITY)
Admission: RE | Admit: 2024-01-26 | Discharge: 2024-01-27 | DRG: 317 | Disposition: A | Discharge status: Home / Self Care | Attending: Cardiology | Admitting: Cardiology

## 2024-01-26 DIAGNOSIS — I2510 Atherosclerotic heart disease of native coronary artery without angina pectoris: Secondary | ICD-10-CM

## 2024-01-26 DIAGNOSIS — Z006 Encounter for examination for normal comparison and control in clinical research program: Secondary | ICD-10-CM

## 2024-01-26 DIAGNOSIS — Z791 Long term (current) use of non-steroidal anti-inflammatories (NSAID): Secondary | ICD-10-CM

## 2024-01-26 DIAGNOSIS — I10 Essential (primary) hypertension: Secondary | ICD-10-CM

## 2024-01-26 DIAGNOSIS — Z87891 Personal history of nicotine dependence: Secondary | ICD-10-CM

## 2024-01-26 DIAGNOSIS — E785 Hyperlipidemia, unspecified: Secondary | ICD-10-CM | POA: Diagnosis present

## 2024-01-26 DIAGNOSIS — I480 Paroxysmal atrial fibrillation: Principal | ICD-10-CM | POA: Diagnosis present

## 2024-01-26 DIAGNOSIS — I4891 Unspecified atrial fibrillation: Secondary | ICD-10-CM | POA: Diagnosis present

## 2024-01-26 DIAGNOSIS — Z7901 Long term (current) use of anticoagulants: Secondary | ICD-10-CM | POA: Diagnosis not present

## 2024-01-26 DIAGNOSIS — R5383 Other fatigue: Secondary | ICD-10-CM | POA: Diagnosis present

## 2024-01-26 DIAGNOSIS — M1990 Unspecified osteoarthritis, unspecified site: Secondary | ICD-10-CM | POA: Diagnosis present

## 2024-01-26 DIAGNOSIS — E039 Hypothyroidism, unspecified: Secondary | ICD-10-CM | POA: Diagnosis present

## 2024-01-26 DIAGNOSIS — Z95818 Presence of other cardiac implants and grafts: Secondary | ICD-10-CM | POA: Insufficient documentation

## 2024-01-26 DIAGNOSIS — Z888 Allergy status to other drugs, medicaments and biological substances status: Secondary | ICD-10-CM | POA: Diagnosis not present

## 2024-01-26 DIAGNOSIS — I080 Rheumatic disorders of both mitral and aortic valves: Secondary | ICD-10-CM | POA: Diagnosis present

## 2024-01-26 HISTORY — PX: ATRIAL FIBRILLATION ABLATION: EP1191

## 2024-01-26 HISTORY — PX: TRANSESOPHAGEAL ECHOCARDIOGRAM (CATH LAB): EP1270

## 2024-01-26 HISTORY — DX: Unspecified osteoarthritis, unspecified site: M19.90

## 2024-01-26 HISTORY — DX: Hypothyroidism, unspecified: E03.9

## 2024-01-26 HISTORY — DX: Cardiac arrhythmia, unspecified: I49.9

## 2024-01-26 HISTORY — PX: LEFT ATRIAL APPENDAGE OCCLUSION: EP1229

## 2024-01-26 HISTORY — DX: Essential (primary) hypertension: I10

## 2024-01-26 LAB — TYPE AND SCREEN
ABO/RH(D): O POS
Antibody Screen: NEGATIVE

## 2024-01-26 LAB — POCT ACTIVATED CLOTTING TIME
Activated Clotting Time: 337 s
Activated Clotting Time: 342 s

## 2024-01-26 LAB — SURGICAL PCR SCREEN
MRSA, PCR: NEGATIVE
Staphylococcus aureus: NEGATIVE

## 2024-01-26 LAB — ECHO TEE

## 2024-01-26 LAB — ABO/RH: ABO/RH(D): O POS

## 2024-01-26 MED ORDER — FENTANYL CITRATE (PF) 100 MCG/2ML IJ SOLN
INTRAMUSCULAR | Status: AC
  Filled 2024-01-26: qty 2

## 2024-01-26 MED ORDER — SODIUM CHLORIDE 0.9% FLUSH
3.0000 mL | Freq: Two times a day (BID) | INTRAVENOUS | Status: DC
  Administered 2024-01-26: 3 mL via INTRAVENOUS

## 2024-01-26 MED ORDER — ACETAMINOPHEN 325 MG PO TABS
ORAL_TABLET | ORAL | Status: AC
  Administered 2024-01-26: 650 mg via ORAL
  Filled 2024-01-26: qty 2

## 2024-01-26 MED ORDER — ATROPINE SULFATE 1 MG/10ML IJ SOSY
PREFILLED_SYRINGE | INTRAMUSCULAR | Status: DC | PRN
  Administered 2024-01-26: 1 mg via INTRAVENOUS

## 2024-01-26 MED ORDER — CEFAZOLIN SODIUM-DEXTROSE 2-4 GM/100ML-% IV SOLN
2.0000 g | INTRAVENOUS | Status: AC
  Administered 2024-01-26: 2 g via INTRAVENOUS

## 2024-01-26 MED ORDER — PANTOPRAZOLE SODIUM 40 MG PO TBEC
40.0000 mg | DELAYED_RELEASE_TABLET | Freq: Every day | ORAL | Status: DC
  Administered 2024-01-26: 40 mg via ORAL
  Filled 2024-01-26: qty 1

## 2024-01-26 MED ORDER — OXYCODONE HCL 5 MG/5ML PO SOLN: 5.0000 mg | Freq: Once | ORAL | Status: AC | PRN

## 2024-01-26 MED ORDER — ATROPINE SULFATE 1 MG/10ML IJ SOSY
PREFILLED_SYRINGE | INTRAMUSCULAR | Status: AC
  Filled 2024-01-26: qty 10

## 2024-01-26 MED ORDER — ATORVASTATIN CALCIUM 80 MG PO TABS: 80.0000 mg | ORAL_TABLET | Freq: Every day | ORAL | Status: DC

## 2024-01-26 MED ORDER — FENTANYL CITRATE (PF) 100 MCG/2ML IJ SOLN
25.0000 ug | INTRAMUSCULAR | Status: DC | PRN
  Administered 2024-01-26: 50 ug via INTRAVENOUS

## 2024-01-26 MED ORDER — ONDANSETRON HCL 4 MG/2ML IJ SOLN: 4.0000 mg | Freq: Four times a day (QID) | INTRAMUSCULAR | Status: DC | PRN

## 2024-01-26 MED ORDER — CEFAZOLIN SODIUM-DEXTROSE 2-4 GM/100ML-% IV SOLN
INTRAVENOUS | Status: AC
  Filled 2024-01-26: qty 100

## 2024-01-26 MED ORDER — APIXABAN 5 MG PO TABS
5.0000 mg | ORAL_TABLET | Freq: Two times a day (BID) | ORAL | Status: DC
  Administered 2024-01-26: 5 mg via ORAL
  Filled 2024-01-26: qty 1

## 2024-01-26 MED ORDER — LABETALOL HCL 5 MG/ML IV SOLN
INTRAVENOUS | Status: DC | PRN
  Administered 2024-01-26 (×8): 5 mg via INTRAVENOUS

## 2024-01-26 MED ORDER — LACTATED RINGERS IV SOLN: INTRAVENOUS | Status: DC

## 2024-01-26 MED ORDER — COLCHICINE 0.6 MG PO TABS
0.6000 mg | ORAL_TABLET | Freq: Two times a day (BID) | ORAL | Status: DC
  Administered 2024-01-26: 0.6 mg via ORAL
  Filled 2024-01-26: qty 1

## 2024-01-26 MED ORDER — IOHEXOL 350 MG/ML SOLN
INTRAVENOUS | Status: DC | PRN
  Administered 2024-01-26: 15 mL

## 2024-01-26 MED ORDER — FENTANYL CITRATE (PF) 100 MCG/2ML IJ SOLN
INTRAMUSCULAR | Status: DC | PRN
  Administered 2024-01-26: 100 ug via INTRAVENOUS

## 2024-01-26 MED ORDER — PROPOFOL 10 MG/ML IV BOLUS
INTRAVENOUS | Status: DC | PRN
  Administered 2024-01-26: 50 mg via INTRAVENOUS
  Administered 2024-01-26: 150 mg via INTRAVENOUS

## 2024-01-26 MED ORDER — MENTHOL 3 MG MT LOZG
1.0000 | LOZENGE | OROMUCOSAL | Status: DC | PRN
  Administered 2024-01-26: 3 mg via ORAL
  Filled 2024-01-26: qty 9

## 2024-01-26 MED ORDER — AMLODIPINE BESYLATE 5 MG PO TABS: 5.0000 mg | ORAL_TABLET | Freq: Every day | ORAL | Status: DC

## 2024-01-26 MED ORDER — SODIUM CHLORIDE 0.9 % IV SOLN: INTRAVENOUS | Status: DC

## 2024-01-26 MED ORDER — OXYCODONE HCL 5 MG PO TABS
ORAL_TABLET | ORAL | Status: AC
  Administered 2024-01-26: 5 mg via ORAL
  Filled 2024-01-26: qty 1

## 2024-01-26 MED ORDER — CHLORHEXIDINE GLUCONATE 0.12 % MT SOLN
OROMUCOSAL | Status: AC
  Administered 2024-01-26: 15 mL
  Filled 2024-01-26: qty 15

## 2024-01-26 MED ORDER — ACETAMINOPHEN 500 MG PO TABS
1000.0000 mg | ORAL_TABLET | Freq: Once | ORAL | Status: AC
  Administered 2024-01-26: 1000 mg via ORAL
  Filled 2024-01-26: qty 2

## 2024-01-26 MED ORDER — LEVOTHYROXINE SODIUM 150 MCG PO TABS
150.0000 ug | ORAL_TABLET | Freq: Every day | ORAL | Status: DC
  Administered 2024-01-27: 150 ug via ORAL
  Filled 2024-01-26: qty 1
  Filled 2024-01-26: qty 2

## 2024-01-26 MED ORDER — LIDOCAINE 2% (20 MG/ML) 5 ML SYRINGE
INTRAMUSCULAR | Status: DC | PRN
  Administered 2024-01-26: 40 mg via INTRAVENOUS

## 2024-01-26 MED ORDER — ROCURONIUM BROMIDE 100 MG/10ML IV SOLN
INTRAVENOUS | Status: DC | PRN
  Administered 2024-01-26: 10 mg via INTRAVENOUS
  Administered 2024-01-26: 60 mg via INTRAVENOUS
  Administered 2024-01-26: 5 mg via INTRAVENOUS

## 2024-01-26 MED ORDER — PROTAMINE SULFATE 10 MG/ML IV SOLN
INTRAVENOUS | Status: DC | PRN
  Administered 2024-01-26: 40 mg via INTRAVENOUS

## 2024-01-26 MED ORDER — HEPARIN SODIUM (PORCINE) 1000 UNIT/ML IJ SOLN
INTRAMUSCULAR | Status: DC | PRN
  Administered 2024-01-26: 2000 [IU] via INTRAVENOUS
  Administered 2024-01-26: 14000 [IU] via INTRAVENOUS

## 2024-01-26 MED ORDER — SUGAMMADEX SODIUM 200 MG/2ML IV SOLN
INTRAVENOUS | Status: DC | PRN
Start: 2024-01-26 — End: 2024-01-26
  Administered 2024-01-26: 167.8 mg via INTRAVENOUS

## 2024-01-26 MED ORDER — SODIUM CHLORIDE 0.9% FLUSH: 3.0000 mL | INTRAVENOUS | Status: DC | PRN

## 2024-01-26 MED ORDER — DEXAMETHASONE SOD PHOSPHATE PF 10 MG/ML IJ SOLN
INTRAMUSCULAR | Status: DC | PRN
  Administered 2024-01-26: 10 mg via INTRAVENOUS

## 2024-01-26 MED ORDER — HEPARIN (PORCINE) IN NACL 2000-0.9 UNIT/L-% IV SOLN
INTRAVENOUS | Status: DC | PRN
  Administered 2024-01-26: 1000 mL

## 2024-01-26 MED ORDER — ACETAMINOPHEN 325 MG PO TABS: 650.0000 mg | ORAL_TABLET | ORAL | Status: DC | PRN

## 2024-01-26 MED ORDER — ONDANSETRON HCL 4 MG/2ML IJ SOLN
INTRAMUSCULAR | Status: DC | PRN
  Administered 2024-01-26: 4 mg via INTRAVENOUS

## 2024-01-26 MED ORDER — PHENYLEPHRINE HCL-NACL 20-0.9 MG/250ML-% IV SOLN
INTRAVENOUS | Status: DC | PRN
Start: 2024-01-26 — End: 2024-01-26
  Administered 2024-01-26: 25 ug/min via INTRAVENOUS

## 2024-01-26 MED ORDER — METOPROLOL SUCCINATE ER 25 MG PO TB24
25.0000 mg | ORAL_TABLET | Freq: Two times a day (BID) | ORAL | Status: DC
  Administered 2024-01-26: 25 mg via ORAL
  Filled 2024-01-26: qty 1

## 2024-01-26 MED ORDER — OXYCODONE HCL 5 MG PO TABS: 5.0000 mg | ORAL_TABLET | Freq: Once | ORAL | Status: AC | PRN

## 2024-01-26 MED ORDER — SODIUM CHLORIDE 0.9 % IV SOLN: 250.0000 mL | INTRAVENOUS | Status: DC | PRN

## 2024-01-26 NOTE — Anesthesia Procedure Notes (Signed)
 Procedure Name: Intubation Date/Time: 01/26/2024 1:09 PM  Performed by: Harrold Macintosh, CRNAPre-anesthesia Checklist: Patient identified, Emergency Drugs available, Suction available and Patient being monitored Patient Re-evaluated:Patient Re-evaluated prior to induction Oxygen Delivery Method: Circle system utilized Preoxygenation: Pre-oxygenation with 100% oxygen Induction Type: IV induction Ventilation: Mask ventilation without difficulty Laryngoscope Size: Miller and 2 Grade View: Grade II Tube type: Oral Tube size: 7.0 mm Number of attempts: 1 Airway Equipment and Method: Stylet and Bite block Placement Confirmation: ETT inserted through vocal cords under direct vision, positive ETCO2 and breath sounds checked- equal and bilateral Secured at: 21 cm Tube secured with: Tape Dental Injury: Teeth and Oropharynx as per pre-operative assessment

## 2024-01-26 NOTE — Transfer of Care (Signed)
 Immediate Anesthesia Transfer of Care Note  Patient: Monique Harrell  Procedure(s) Performed: ATRIAL FIBRILLATION ABLATION LEFT ATRIAL APPENDAGE OCCLUSION TRANSESOPHAGEAL ECHOCARDIOGRAM  Patient Location: Cath Lab  Anesthesia Type:General  Level of Consciousness: awake, alert , and oriented  Airway & Oxygen Therapy: Patient Spontanous Breathing  Post-op Assessment: Report given to RN, Post -op Vital signs reviewed and stable, Patient moving all extremities X 4, and Patient able to stick tongue midline  Post vital signs: Reviewed and stable  Last Vitals:  Vitals Value Taken Time  BP 118/74 01/26/24 15:15  Temp 98.6   Pulse 62 01/26/24 15:20  Resp 13 01/26/24 15:20  SpO2 93 % 01/26/24 15:20  Vitals shown include unfiled device data.  Last Pain:  Vitals:   01/26/24 1005  TempSrc: Oral         Complications: No notable events documented.

## 2024-01-26 NOTE — H&P (Signed)
 Electrophysiology Office Note:     Date:  01/26/2024    ID:  Zilpha, Mcandrew Feb 25, 1956, MRN [id number]   [facility] Cardiologist:  None  [facility] Electrophysiologist:  [name], MD    Referring MD: Kate Lonni CROME*    Chief Complaint: Atrial fibrillation   History of Present Illness:     Ms. Kea is a 68 year old woman who I am seeing today for an evaluation of atrial fibrillation at the request of Dr. Kate.  The patient has a history of hypertension, hyperlipidemia, hypothyroidism.  She saw Dr. Kate Aug 18, 2023.  She reported years of palpitations.  A ZIO monitor in April of this year showed a 2% burden of atrial fibrillation with an average rate during atrial fibrillation of 130 bpm.  She reports episodes of dizziness and shortness of breath.  She was started on Eliquis  for stroke prophylaxis.  She does take daily NSAIDs for arthritis.  At the last appointment with Dr. Kate she reported a desire to avoid long-term exposure to anticoagulation and is being referred to discuss possible left atrial appendage occlusion.   She reports being short of breath and fatigued while in atrial fibrillation.  She says when she first went in A-fib a few days ago she felt terrible but her symptoms have gradually improved.  She cannot really tell if she is out of rhythm right now.  Presents for AF ablation and LAAO. Procedures reviewed.   Objective Their past medical, social and family history was reviewed.     ROS:   Please see the history of present illness.    All other systems reviewed and are negative.   EKGs/Labs/Other Studies Reviewed:     The following studies were reviewed today:   June 28, 2023 EKG shows atrial fibrillation with a ventricular rate of 140 bpm   ZIO monitor personally reviewed (media tab, March/April 2025) Atrial fibrillation        Physical Exam:     VS:  BP 146/87   Pulse 51   Ht 5' 6 (1.676 m)   Wt 183 lb  9.6 oz (83.3 kg)   SpO2 98%   BMI 29.63 kg/m         Wt Readings from Last 3 Encounters:  08/31/23 183 lb 9.6 oz (83.3 kg)  08/18/23 189 lb (85.7 kg)  06/28/23 180 lb (81.6 kg)      GEN: no distress CARD: Irregularly irregular, tachycardic (discordant with measured pulse rate), No MRG RESP: No IWOB. CTAB.         Assessment ASSESSMENT AND PLAN:     1. Paroxysmal atrial fibrillation (HCC)   2. Essential hypertension       #Atrial fibrillation Symptomatic On Eliquis  for stroke prophylaxis but prefers a stroke risk mitigation strategy that avoids indefinite exposure to anticoagulation given patient preference and desire to take NSAIDs for pain.  I discussed rhythm control strategies for her atrial fibrillation and stroke risk mitigation strategies.  I discussed conservative therapy, antiarrhythmic drugs and catheter ablation.  I discussed anticoagulation and left atrial appendage occlusion.  I think she is an acceptable candidate for concomitant ablation/watchman.   Discussed treatment options today for AF including antiarrhythmic drug therapy and ablation. Discussed risks, recovery and likelihood of success with each treatment strategy. Risk, benefits, and alternatives to EP study and ablation for afib were discussed. These risks include but are not limited to stroke, bleeding, vascular damage, tamponade, perforation, damage to the esophagus, lungs, phrenic nerve  and other structures, pulmonary vein stenosis, worsening renal function, coronary vasospasm and death.  Discussed potential need for repeat ablation procedures and antiarrhythmic drugs after an initial ablation. The patient understands these risk and wishes to proceed.  We will therefore proceed with catheter ablation at the next available time.  Carto, ICE, anesthesia are requested for the procedure.  Will also obtain CT PV protocol prior to the procedure to exclude LAA thrombus and further evaluate atrial anatomy.    -------------------   I have seen [facility] in the office today who is being considered for a Watchman left atrial appendage closure device. I believe they will benefit from this procedure given their history of atrial fibrillation, CHA2DS2-VASc score of 3 and unadjusted ischemic stroke rate of 3.2% per year. Unfortunately, the patient is not felt to be a long term anticoagulation candidate secondary to chronic NSAID use and patient preference. The patient's chart has been reviewed and I feel that they would be a candidate for short term oral anticoagulation after Watchman implant.    It is my belief that after undergoing a LAA closure procedure, [facility] will not need long term anticoagulation which eliminates anticoagulation side effects and major bleeding risk.    Procedural risks for the Watchman implant have been reviewed with the patient including a 0.5% risk of stroke, <1% risk of perforation and <1% risk of device embolization. Other risks include bleeding, vascular damage, tamponade, worsening renal function, and death. The patient understands these risk and wishes to proceed.       The published clinical data on the safety and effectiveness of WATCHMAN include but are not limited to the following: - Holmes DR, Jess BEARD, Sick P et al. for the PROTECT AF Investigators. Percutaneous closure of the left atrial appendage versus warfarin therapy for prevention of stroke in patients with atrial fibrillation: a randomised non-inferiority trial. Lancet 2009; 374: 534-42. GLENWOOD Jess BEARD, Doshi SK, Jonita VEAR Satchel D et al. on behalf of the PROTECT AF Investigators. Percutaneous Left Atrial Appendage Closure for Stroke Prophylaxis in Patients With Atrial Fibrillation 2.3-Year Follow-up of the PROTECT AF (Watchman Left Atrial Appendage System for Embolic Protection in Patients With Atrial Fibrillation) Trial. Circulation 2013; 127:720-729. - Alli O, Doshi S,  Kar S, Reddy VY,  Sievert H et al. Quality of Life Assessment in the Randomized PROTECT AF (Percutaneous Closure of the Left Atrial Appendage Versus Warfarin Therapy for Prevention of Stroke in Patients With Atrial Fibrillation) Trial of Patients at Risk for Stroke With Nonvalvular Atrial Fibrillation. J Am Coll Cardiol 2013; 61:1790-8. GLENWOOD Satchel DR, Archer RAMAN, Price M, Whisenant B, Sievert H, Doshi S, Huber K, Reddy V. Prospective randomized evaluation of the Watchman left atrial appendage Device in patients with atrial fibrillation versus long-term warfarin therapy; the PREVAIL trial. Journal of the [employer] of Cardiology, Vol. 4, No. 1, 2014, 1-11. - Kar S, Doshi SK, Sadhu A, Horton R, Osorio J et al. Primary outcome evaluation of a next-generation left atrial appendage closure device: results from the PINNACLE FLX trial. Circulation 2021;143(18)1754-1762.      After today's visit with the patient which was dedicated solely for shared decision making visit regarding LAA closure device, the patient decided to proceed with the LAA appendage closure procedure scheduled to be done in the near future at [facility]. Prior to the procedure, I would like to obtain a gated CT scan of the chest with contrast timed for PV/LA visualization.    Additionally, the  patient will need an echo.   HAS-BLED score 3 Hypertension Yes  Abnormal renal and liver function (Dialysis, transplant, Cr >2.26 mg/dL /Cirrhosis or Bilirubin >2x Normal or AST/ALT/AP >3x Normal) No  Stroke No  Bleeding No  Labile INR (Unstable/high INR) No  Elderly (>65) Yes  Drugs or alcohol (>= 8 drinks/week, anti-plt or NSAID) Yes    CHA2DS2-VASc Score = 3  The patient's score is based upon: CHF History: 0 HTN History: 1 Diabetes History: 0 Stroke History: 0 Vascular Disease History: 0 Age Score: 1 Gender Score: 1   I am also going to have her see the A-fib clinic next week to see if she is still out of rhythm.  If she remains out of  rhythm, recommend cardioversion.  I am not in the schedule today as she has previously not had sustained episodes of atrial fibrillation.   Presents for AF ablation and LAAO. Procedures reviewed.   Signed, Ole DASEN. Cindie, MD, [facility], [facility] 01/26/2024 Electrophysiology  [facility]

## 2024-01-26 NOTE — Anesthesia Postprocedure Evaluation (Signed)
 Anesthesia Post Note  Patient: Monique Harrell  Procedure(s) Performed: ATRIAL FIBRILLATION ABLATION LEFT ATRIAL APPENDAGE OCCLUSION TRANSESOPHAGEAL ECHOCARDIOGRAM     Patient location during evaluation: Cath Lab Anesthesia Type: General Level of consciousness: awake and alert, oriented and patient cooperative Pain management: pain level controlled Vital Signs Assessment: post-procedure vital signs reviewed and stable Respiratory status: spontaneous breathing, nonlabored ventilation, respiratory function stable and patient connected to nasal cannula oxygen Cardiovascular status: blood pressure returned to baseline and stable Postop Assessment: no apparent nausea or vomiting Anesthetic complications: no   No notable events documented.  Last Vitals:  Vitals:   01/26/24 1516 01/26/24 1521  BP: 118/74 116/75  Pulse: 65 64  Resp:  10  Temp:  36.7 C  SpO2: 92% 92%    Last Pain:  Vitals:   01/26/24 1521  TempSrc: Oral  PainSc: 0-No pain                 Soundra Lampley,E. Jayce Boyko

## 2024-01-26 NOTE — Anesthesia Preprocedure Evaluation (Addendum)
 Anesthesia Evaluation  Patient identified by MRN, date of birth, ID band Patient awake    Reviewed: Allergy & Precautions, NPO status , Patient's Chart, lab work & pertinent test results, reviewed documented beta blocker date and time   History of Anesthesia Complications Negative for: history of anesthetic complications  Airway Mallampati: II  TM Distance: >3 FB Neck ROM: Full    Dental  (+) Dental Advisory Given   Pulmonary former smoker   breath sounds clear to auscultation       Cardiovascular hypertension, Pt. on medications and Pt. on home beta blockers (-) angina + CAD (mod single vessel disease)  + dysrhythmias Atrial Fibrillation  Rhythm:Regular Rate:Normal  10/2023 ECHO:  1. Aortic dilatation noted. Aneurysm of the ascending aorta, measuring 50  mm. There is severe dilatation of the ascending aorta.   2. LV EF, by estimation, is 60 to 65%, by 3D volume is 68 %. The left ventricle has  normal function, no regional wall motion abnormalities. There is moderate LVH. Left ventricular diastolic parameters were grossly normal. The average left ventricular global longitudinal strain is -21.6 %. The global longitudinal strain is normal.   3. RVF is normal. The right ventricular size is normal. There is normal pulmonary artery systolic pressure. The estimated right ventricular systolic pressure is 25.8 mmHg.   4. Left atrial size was mild to moderately dilated.   5. Right atrial size was mildly dilated.   6. The mitral valve is grossly normal. Trivial mitral valve regurgitation. No evidence of mitral stenosis.   7. The aortic valve is tricuspid. Aortic valve regurgitation is not visualized. No aortic stenosis is present.     Neuro/Psych negative neurological ROS     GI/Hepatic negative GI ROS, Neg liver ROS,,,  Endo/Other  Hypothyroidism  BMI 30  Renal/GU negative Renal ROS     Musculoskeletal   Abdominal   Peds   Hematology eliquis    Anesthesia Other Findings   Reproductive/Obstetrics                              Anesthesia Physical Anesthesia Plan  ASA: 3  Anesthesia Plan: General   Post-op Pain Management: Tylenol  PO (pre-op)*   Induction: Intravenous  PONV Risk Score and Plan: 3 and Ondansetron , Dexamethasone and Treatment may vary due to age or medical condition  Airway Management Planned: Oral ETT  Additional Equipment: ClearSight  Intra-op Plan:   Post-operative Plan: Extubation in OR  Informed Consent: I have reviewed the patients History and Physical, chart, labs and discussed the procedure including the risks, benefits and alternatives for the proposed anesthesia with the patient or authorized representative who has indicated his/her understanding and acceptance.     Dental advisory given  Plan Discussed with: CRNA and Surgeon  Anesthesia Plan Comments:          Anesthesia Quick Evaluation

## 2024-01-26 NOTE — Discharge Instructions (Signed)
 [facility] Procedure, Care After  Procedure MD: Dr. Isidoro Donning Clinical Coordinator: Karsten Fells, RN  This sheet gives you information about how to care for yourself after your procedure. Your health care provider may also give you more specific instructions. If you have problems or questions, contact your health care provider.  What can I expect after the procedure? After the procedure, it is common to have: Bruising around your puncture site. Tenderness around your puncture site. Tiredness (fatigue).  Medication instructions It is very important to continue to take your blood thinner as directed by your doctor after the Watchman procedure. Call your procedure doctor's office with question or concerns. If you are on Coumadin (warfarin), you will have your INR checked the week after your procedure, with a goal INR of 2.0 - 3.0. Please follow your medication instructions on your discharge summary. Only take the medications listed on your discharge paperwork.  Follow up You will be seen in 6 weeks after your procedure You will have a repeat CT scan or Echocardiogram approximately 8 weeks after your procedure mark to check your device You will follow up the MD/APP who performed your procedure 6 months after your procedure The Watchman Clinical Coordinator will check in with you from time to time, including 1 and 2 years after your procedure.  NO DENTAL CLEANINGS FOR 45 days. After that, you will require antibiotics for dental procedures the first 6 months.   Follow these instructions at home: Puncture site care  Follow instructions from your health care provider about how to take care of your puncture site. Make sure you: If present, leave stitches (sutures), skin glue, or adhesive strips in place.  If a large square bandage is present, this may be removed 24 hours after surgery.  Check your puncture site every day for signs of infection. Check for: Redness, swelling, or pain. Fluid  or blood. If your puncture site starts to bleed, lie down on your back, apply firm pressure to the area, and contact your health care provider. Warmth. Pus or a bad smell. Driving Do not drive yourself home if you received sedation Do not drive for at least 4 days after your procedure or however long your health care provider recommends. (Do not resume driving if you have previously been instructed not to drive for other health reasons.) Do not spend greater than 1 hour at a time in a car for the first 3 days. Stop and take a break with a 5 minute walk at least every hour.  Do not drive or use heavy machinery while taking prescription pain medicine.  Activity Avoid activities that take a lot of effort, including exercise, for at least 7 days after your procedure. For the first 3 days, avoid sitting for longer than one hour at a time.  Avoid alcoholic beverages, signing paperwork, or participating in legal proceedings for 24 hours after receiving sedation Do not lift anything that is heavier than 10 lb (4.5 kg) for one week.  No sexual activity for 1 week.  Return to your normal activities as told by your health care provider. Ask your health care provider what activities are safe for you. General instructions Take over-the-counter and prescription medicines only as told by your health care provider. Do not use any products that contain nicotine or tobacco, such as cigarettes and e-cigarettes. If you need help quitting, ask your health care provider. You may shower after 24 hours, but Do not take baths, swim, or use a hot tub for  1 week.  Do not drink alcohol for 24 hours after your procedure. Keep all follow-up visits as told by your health care provider. This is important. Dental Work: You will require antibiotics prior to any dental work, including cleanings, for 6 months after your Watchman implantation to help protect you from infection. After 6 months, antibiotics are no longer  required. Contact a health care provider if: You have redness, mild swelling, or pain around your puncture site. You have soreness in your throat or at your puncture site that does not improve after several days You have fluid or blood coming from your puncture site that stops after applying firm pressure to the area. Your puncture site feels warm to the touch. You have pus or a bad smell coming from your puncture site. You have a fever. You have chest pain or discomfort that spreads to your neck, jaw, or arm. You are sweating a lot. You feel nauseous. You have a fast or irregular heartbeat. You have shortness of breath. You are dizzy or light-headed and feel the need to lie down. You have pain or numbness in the arm or leg closest to your puncture site. Get help right away if: Your puncture site suddenly swells. Your puncture site is bleeding and the bleeding does not stop after applying firm pressure to the area. These symptoms may represent a serious problem that is an emergency. Do not wait to see if the symptoms will go away. Get medical help right away. Call your local emergency services (911 in the [place] Do not drive yourself to the hospital. Summary After the procedure, it is normal to have bruising and tenderness at the puncture site in your groin, neck, or forearm. Check your puncture site every day for signs of infection. Get help right away if your puncture site is bleeding and the bleeding does not stop after applying firm pressure to the area. This is a medical emergency.  This information is not intended to replace advice given to you by your health care provider. Make sure you discuss any questions you have with your health care provider.

## 2024-01-26 NOTE — Discharge Summary (Signed)
 Electrophysiology Discharge Summary   Patient ID: Monique Harrell,  MRN: [id number], DOB/AGE: 1955/06/05 68 y.o.  Admit date: 01/26/2024 Discharge date: 01/26/2024  Primary Care Physician: Monique Leni Edyth DELENA, MD  Primary Cardiologist: Monique LITTIE Nanas, MD  Electrophysiologist: Monique ONEIDA HOLTS, MD {Click to update primary MD,subspecialty MD or APP then REFRESH:1}    Primary Discharge Diagnosis:  Paroxysmal Atrial Fibrillation Poor candidacy for long term anticoagulation due to preference to avoid long-term oral anticoagulation / desire to use NSAIDS for pain control  Secondary Discharge Diagnosis:  HTN HLD Hypothyroidism  Procedures This Admission:  Transeptal Puncture Intra-procedural TEE which showed no LAA thrombus Left atrial appendage occlusive device placement on 01/26/24 by Dr. HOLTS.  PVI Ablation 01/26/24  This study demonstrated: 1. Successful PVI 2. Successful Watchman device implant (left atrial appendage occlusion) 3. Intracardiac echo reveals trivial pericardial effusion, normal left atrial architecture 4. Transesophageal echo reveals trivial pericardial effusion, no left atrial appendage thrombus, successful left atrial appendage closure 5. No early apparent complications. 6. Colchicine 0.6mg  PO BID x 5 days 7. Protonix 40mg  PO daily x 45 days     Post Implant Anticoagulation Strategy: Continue Eliquis  5mg  by mouth twice daily for 90 days after ablation/watchman. After 90 days, stop Eliquis  and start Plavix 75mg  by mouth once daily to complete 6 months of post Watchman implant medical therapy. Plan for CT scan 60 days after implant to assess appendage patency and Watchman position.  {Case conclusion here :1}   Brief HPI: Monique Harrell is a 68 y.o. female with a history of Paroxysmal Atrial Fibrillation who was referred to Electrophysiology in the outpatient setting    Hospital Course:  The patient was admitted and underwent left atrial  appendage occlusive device placement as above.  The patient was monitored overnight and has done very well with no concerns. ***Groin site has been stable without evidence of hematoma or bleeding. Wound care and restrictions were reviewed with the patient.   The patient has been scheduled for post procedure follow up with EP APP in approximately 6 weeks. They will restart Eliquis  this evening and continue for 90 days then stop. At that time she will transition to Plavix 75mg  daily to complete 6 months of therapy. They will require dental SBE for 6 month post op and should refrain from dental work or cleanings for the first 45 days post implant. SBE to be RXd at follow up.   A repeat CT scan will be performed in approximately 60 days to ensure proper seal of the device.    Physical Exam: Vitals:   01/26/24 1005  BP: (!) 146/87  Pulse: (!) 51  Resp: 17  Temp: 97.6 F (36.4 C)  TempSrc: Oral  SpO2: 95%  Weight: 83.9 kg  Height: 5' 6 (1.676 m)    GEN: Well nourished, well developed in no acute distress NECK: No JVD; No carotid bruits CARDIAC: {EPRHYTHM:28826}, no murmurs, rubs, gallops RESPIRATORY:  Clear to auscultation without rales, wheezing or rhonchi  ABDOMEN: Soft, non-tender, non-distended EXTREMITIES:  No edema; No deformity. Groin site {Blank single:19197::Stable}     Discharge Medications:  Allergies as of 01/26/2024       Reactions   Quinolones Other (See Comments)   Ascending thoracic aortic aneurysm      Med Rec must be completed prior to using this SMARTLINK***       Disposition:  Home with usual follow up as in AVS  Duration of Discharge Encounter:  APP Time: ***  Signed, ***

## 2024-01-27 ENCOUNTER — Other Ambulatory Visit (HOSPITAL_COMMUNITY): Payer: Self-pay

## 2024-01-27 ENCOUNTER — Encounter (HOSPITAL_COMMUNITY): Payer: Self-pay | Admitting: Cardiology

## 2024-01-27 ENCOUNTER — Encounter: Payer: Self-pay | Admitting: Cardiology

## 2024-01-27 DIAGNOSIS — I080 Rheumatic disorders of both mitral and aortic valves: Secondary | ICD-10-CM | POA: Diagnosis not present

## 2024-01-27 DIAGNOSIS — I10 Essential (primary) hypertension: Secondary | ICD-10-CM | POA: Diagnosis not present

## 2024-01-27 DIAGNOSIS — I480 Paroxysmal atrial fibrillation: Secondary | ICD-10-CM | POA: Diagnosis not present

## 2024-01-27 DIAGNOSIS — E039 Hypothyroidism, unspecified: Secondary | ICD-10-CM | POA: Diagnosis not present

## 2024-01-27 MED ORDER — COLCHICINE 0.6 MG PO TABS
0.6000 mg | ORAL_TABLET | Freq: Two times a day (BID) | ORAL | 0 refills | Status: AC
  Filled 2024-01-27: qty 10, 5d supply, fill #0

## 2024-01-27 MED ORDER — PANTOPRAZOLE SODIUM 40 MG PO TBEC
40.0000 mg | DELAYED_RELEASE_TABLET | Freq: Every day | ORAL | 0 refills | Status: AC
  Filled 2024-01-27: qty 45, 45d supply, fill #0

## 2024-01-27 NOTE — Plan of Care (Signed)
  Problem: Education: Goal: Understanding of disease, treatment, and recovery process will improve Outcome: Adequate for Discharge   Problem: Activity: Goal: Ability to return to baseline activity level will improve Outcome: Adequate for Discharge   Problem: Cardiac: Goal: Ability to maintain adequate cardiovascular perfusion will improve Outcome: Adequate for Discharge Goal: Vascular access site(s) Level 0-1 will be maintained Outcome: Adequate for Discharge

## 2024-01-31 ENCOUNTER — Telehealth: Payer: Self-pay

## 2024-01-31 ENCOUNTER — Other Ambulatory Visit: Payer: Self-pay | Admitting: Cardiology

## 2024-01-31 DIAGNOSIS — I480 Paroxysmal atrial fibrillation: Secondary | ICD-10-CM

## 2024-01-31 DIAGNOSIS — Z95818 Presence of other cardiac implants and grafts: Secondary | ICD-10-CM

## 2024-01-31 NOTE — Telephone Encounter (Signed)
 Prescription refill request for Eliquis  received. Indication:afib Last office visit:8/25 Scr:0.80  9/25 Age: 68 Weight:83.9  kg  Prescription refilled

## 2024-01-31 NOTE — Telephone Encounter (Signed)
  [facility]   Watchman Team  Contacted the patient regarding discharge from [facility] on 01/27/2024  The patient understands to follow up with Thom Heinrich on 02/27/2024  The patient understands discharge instructions? Yes  The patient understands medications and regimen? Yes   The patient reports groin sites look healthy with no S/S of bleeding or infection  Scheduled post procedure imaging on 04/06/2024  The patient understands to call with any questions or concerns prior to scheduled visit.   The patient was grateful for call and agreed with plan.

## 2024-02-03 ENCOUNTER — Telehealth: Payer: Self-pay | Admitting: Cardiology

## 2024-02-03 ENCOUNTER — Other Ambulatory Visit: Payer: Self-pay | Admitting: Student

## 2024-02-03 DIAGNOSIS — R194 Change in bowel habit: Secondary | ICD-10-CM

## 2024-02-03 NOTE — Telephone Encounter (Signed)
   Name:  Lamoyne Palencia  DOB:  1955-12-13  MRN:  [id number]   Primary Cardiologist: [name], MD  Chart reviewed as part of pre-operative protocol coverage. Patient was contacted 02/03/2024 in reference to pre-operative risk assessment for pending surgery as outlined below.  Laniesha Das was last seen on 01/27/2024 by Dr. Cindie.  Since that day, Okie Jansson has had Watchman Device placed.   Per discharge summary 01/27/2024 Post Implant Anticoagulation Strategy: Continue Eliquis  5mg  by mouth twice daily for 90 days after ablation/watchman. After 90 days, stop Eliquis  and start Plavix 75mg  by mouth once daily to complete 6 months of post Watchman implant medical therapy. Plan for CT scan 60 days after implant to assess appendage patency and Watchman position.     Therefore cannot hold Eliquis  or Plavix for colonoscopy. Will need to wait until April 2026.  Please resend clearance request when planned again.  Thank you,   Lamarr Satterfield, NP 02/03/2024, 3:40 PM

## 2024-02-03 NOTE — Telephone Encounter (Signed)
   Pre-operative Risk Assessment    Patient Name: Monique Harrell  DOB: 1956/01/02 MRN: [id number]   Date of last office visit: 12/08/23 Date of next office visit: 04/26/24   Request for Surgical Clearance    Procedure:  Colonoscopy   Date of Surgery:  Clearance TBD                                Surgeon:  Dr. Saintclair Surgeon's Group or Practice Name:  [facility]  Phone number:  [phone]  Fax number:  [phone] or [phone]   Type of Clearance Requested:   - Medical  - Pharmacy:  Hold Apixaban  (Eliquis )     Type of Anesthesia:  Propofol   Additional requests/questions:  Caller (Whitney) noted patient would need to have colonoscopy sooner to rule out cancer.  Signed, Jasmin B Wilson   02/03/2024, 3:27 PM

## 2024-02-16 ENCOUNTER — Inpatient Hospital Stay
Admission: RE | Admit: 2024-02-16 | Discharge: 2024-02-16 | Disposition: A | Source: Ambulatory Visit | Attending: Student

## 2024-02-16 DIAGNOSIS — R194 Change in bowel habit: Secondary | ICD-10-CM

## 2024-02-16 MED ORDER — IOPAMIDOL (ISOVUE-300) INJECTION 61%
100.0000 mL | Freq: Once | INTRAVENOUS | Status: AC | PRN
  Administered 2024-02-16: 100 mL via INTRAVENOUS

## 2024-02-27 ENCOUNTER — Encounter (HOSPITAL_COMMUNITY): Payer: Self-pay | Admitting: Internal Medicine

## 2024-02-27 ENCOUNTER — Ambulatory Visit (HOSPITAL_COMMUNITY)
Admission: RE | Admit: 2024-02-27 | Discharge: 2024-02-27 | Disposition: A | Discharge status: Home / Self Care | Source: Ambulatory Visit | Attending: Internal Medicine | Admitting: Internal Medicine

## 2024-02-27 ENCOUNTER — Ambulatory Visit (HOSPITAL_COMMUNITY): Admitting: Physician Assistant

## 2024-02-27 VITALS — BP 116/88 | HR 47 | Ht 66.0 in | Wt 199.2 lb

## 2024-02-27 DIAGNOSIS — D6869 Other thrombophilia: Secondary | ICD-10-CM

## 2024-02-27 DIAGNOSIS — I480 Paroxysmal atrial fibrillation: Secondary | ICD-10-CM | POA: Diagnosis not present

## 2024-02-27 MED ORDER — METOPROLOL SUCCINATE ER 25 MG PO TB24: 25.0000 mg | ORAL_TABLET | Freq: Every day | ORAL | 3 refills | Status: AC

## 2024-02-27 NOTE — Patient Instructions (Signed)
 Decrease metoprolol to 25 mg once daily.

## 2024-02-27 NOTE — Progress Notes (Signed)
 Primary Care Physician: [name], MD Primary Cardiologist: [name], MD Electrophysiologist: [name], MD     Referring Physician: Dr. Holts Monique Harrell is a 68 y.o. female with a history of HTN, HLD, hypothyroidism, and atrial fibrillation who presents for consultation in the [facility]. Seen by Dr. Holts on 5/14 with plan to perform ablation and Watchman procedure; noted to be in Afib with RVR at OV. Patient is on Eliquis  5 mg BID for a CHADS2VASC score of 3.  On follow up 02/27/24, patient is currently in NSR. S/p Afib ablation and Watchman procedure on 01/26/24 by Dr. Holts. No episodes of Afib since ablation. No chest pain or SOB. Leg sites healed without issue. No missed doses of anticoagulant.  Today, she denies symptoms of orthopnea, PND, lower extremity edema, dizziness, presyncope, syncope, snoring, daytime somnolence, bleeding, or neurologic sequela. The patient is tolerating medications without difficulties and is otherwise without complaint today.    she has a BMI of Body mass index is 32.15 kg/m.SABRA Filed Weights   02/27/24 1036  Weight: 90.4 kg     Current Outpatient Medications  Medication Sig Dispense Refill   acetaminophen  (TYLENOL ) 500 MG tablet Take 500-1,000 mg by mouth every 6 (six) hours as needed (pain.).     alendronate (FOSAMAX) 70 MG tablet Take 70 mg by mouth every Friday.     amLODipine (NORVASC) 5 MG tablet Take 1 tablet (5 mg total) by mouth daily.     apixaban  (ELIQUIS ) 5 MG TABS tablet Take 1 tablet by mouth twice daily 90 tablet 1   atorvastatin  (LIPITOR) 80 MG tablet Take 1 tablet (80 mg total) by mouth daily. 90 tablet 3   Cholecalciferol (VITAMIN D-3 PO) Take 2,000 mcg by mouth daily.     colchicine 0.6 MG tablet Take 1 tablet (0.6 mg total) by mouth 2 (two) times daily for 5 days. 10 tablet 0   levothyroxine (SYNTHROID) 137 MCG tablet Take 137 mcg by mouth every  morning.     Multiple Vitamins-Minerals (PRESERVISION AREDS 2 PO) Take 1 tablet by mouth at bedtime.     nitroGLYCERIN  (NITROSTAT ) 0.4 MG SL tablet Place 1 tablet (0.4 mg total) under the tongue every 5 (five) minutes as needed for chest pain. 25 tablet 5   pantoprazole (PROTONIX) 40 MG tablet Take 1 tablet (40 mg total) by mouth daily. 45 tablet 0   traMADol (ULTRAM) 50 MG tablet Take 50 mg by mouth every 12 (twelve) hours as needed (pain.).     metoprolol  succinate (TOPROL  XL) 25 MG 24 hr tablet Take 1 tablet (25 mg total) by mouth daily. 90 tablet 3   No current facility-administered medications for this encounter.    Atrial Fibrillation Management history:  Previous antiarrhythmic drugs: none Previous cardioversions: none Previous ablations: 01/26/24 (Afib ablation and Watchman) Anticoagulation history: Eliquis    ROS- All systems are reviewed and negative except as per the HPI above.  Physical Exam: BP 116/88   Pulse (!) 47   Ht 5' 6 (1.676 m)   Wt 90.4 kg   BMI 32.15 kg/m   GEN- The patient is well appearing, alert and oriented x 3 today.   Neck - no JVD or carotid bruit noted Lungs- Clear to ausculation bilaterally, normal work of breathing Heart- Regular bradycardic rate and rhythm, no murmurs, rubs or gallops, PMI not laterally displaced Extremities- no clubbing, cyanosis, or edema Skin - no rash  or ecchymosis noted   EKG today demonstrates  Vent. rate 47 BPM PR interval 160 ms QRS duration 92 ms QT/QTcB 444/392 ms P-R-T axes 39 -20 37 Sinus bradycardia Otherwise normal ECG When compared with ECG of 27-Jan-2024 03:50, Previous ECG is present  Echo 11/01/23:  1. Aortic dilatation noted. Aneurysm of the ascending aorta, measuring 50  mm. There is severe dilatation of the ascending aorta.   2. Left ventricular ejection fraction, by estimation, is 60 to 65%. Left  ventricular ejection fraction by 3D volume is 68 %. The left ventricle has  normal function. The  left ventricle has no regional wall motion  abnormalities. There is moderate left  ventricular hypertrophy. Left ventricular diastolic parameters were  grossly normal. The average left ventricular global longitudinal strain is  -21.6 %. The global longitudinal strain is normal.   3. Right ventricular systolic function is normal. The right ventricular  size is normal. There is normal pulmonary artery systolic pressure. The  estimated right ventricular systolic pressure is 25.8 mmHg.   4. Left atrial size was mild to moderately dilated.   5. Right atrial size was mildly dilated.   6. The mitral valve is grossly normal. Trivial mitral valve  regurgitation. No evidence of mitral stenosis.   7. The aortic valve is tricuspid. Aortic valve regurgitation is not  visualized. No aortic stenosis is present.   8. The inferior vena cava is normal in size with greater than 50%  respiratory variability, suggesting right atrial pressure of 3 mmHg.   9. Increased flow velocities may be secondary to anemia, thyrotoxicosis,  hyperdynamic or high flow state.   ASSESSMENT & PLAN CHA2DS2-VASc Score = 3  The patient's score is based upon: CHF History: 0 HTN History: 1 Diabetes History: 0 Stroke History: 0 Vascular Disease History: 0 Age Score: 1 Gender Score: 1       ASSESSMENT AND PLAN: Paroxysmal Atrial Fibrillation (ICD10:  I48.0) The patient's CHA2DS2-VASc score is 3, indicating a 3.2% annual risk of stroke.   S/p Afib ablation and Watchman procedure on 01/26/24.  Patient is currently in NSR. Reduce Toprol  to 25 mg once daily.     Secondary Hypercoagulable State (ICD10:  D68.69) The patient is at significant risk for stroke/thromboembolism based upon her CHA2DS2-VASc Score of 3.  Continue Apixaban  (Eliquis ).  S/p Watchman procedure on 01/26/24. Continue Eliquis  5 mg BID as directed.     Follow up with EP as scheduled.    Terra Pac, PA-C  [facility] [address] [place], [place] [address] [phone]

## 2024-02-28 ENCOUNTER — Ambulatory Visit: Admitting: Cardiology

## 2024-04-06 ENCOUNTER — Ambulatory Visit (HOSPITAL_COMMUNITY)
Admission: RE | Admit: 2024-04-06 | Discharge: 2024-04-06 | Disposition: A | Discharge status: Home / Self Care | Source: Ambulatory Visit | Attending: Cardiovascular Disease | Admitting: Cardiovascular Disease

## 2024-04-06 DIAGNOSIS — Z95818 Presence of other cardiac implants and grafts: Secondary | ICD-10-CM | POA: Insufficient documentation

## 2024-04-06 DIAGNOSIS — I480 Paroxysmal atrial fibrillation: Secondary | ICD-10-CM | POA: Diagnosis not present

## 2024-04-06 MED ORDER — IOHEXOL 350 MG/ML SOLN
80.0000 mL | Freq: Once | INTRAVENOUS | Status: AC | PRN
  Administered 2024-04-06: 80 mL via INTRAVENOUS

## 2024-04-09 ENCOUNTER — Ambulatory Visit: Payer: Self-pay | Admitting: Cardiology

## 2024-04-25 NOTE — Progress Notes (Addendum)
 " Cardiology Office Note:  .   Date:  04/25/2024  ID:  Monique Harrell, DOB 02/17/1956, MRN [id number] PCP: [name], MD  [facility] Providers Cardiologist:  [name], MD Cardiology APP:  Madie Jon Garre, [place]  Electrophysiologist:  [name], MD {  History of Present Illness: .   Monique Harrell is a 69 y.o. female w/PMHx of  HTN, HLD, hypothyroidism Dilated thoracic Aorta, elevated coronary Ca score AFib  She was referred to Dr. Holts for consideration of watchman procedure with fairly new AFib diagnosis and preference to avoid longterm OAC in the environment of arthritis and NSAID use Low AFib burden on monitoring but fast and symptomatic Planned for Afib ablation/watchman  Seeing cards team a few times since then  Underwent AFib ablation/watchman implant on 01/26/24 Discharged 01/27/24 Post procedure (s) strategy:  Eliquis  5mg  by mouth twice daily for 90 days after ablation/watchman. After 90 days, stop Eliquis  and start Plavix  75mg  by mouth once daily to complete 6 months of post Watchman implant medical therapy. Plan for CT scan 60 days after implant to assess appendage patency and Watchman position.      Saw the AFib clinic 02/27/24, no symptoms of AFib, post procedural concerns Toprol  dose reduced w/SB 47bpm  CT done 04/06/24  Today's visit is scheduled as a 90mo post PVI/LAAO visit ROS:   She is doing just OK. Worries a lot about her health, cardiac disease.  Worries about falling out and turned on her watch GPS tracker.  She does not think she has had AFib, her watch reports <2% since the ablation, and does feel like her SOB/DOE is improved, but not all all resolved. She worries about her heart disease, gets winded very easily, even vacuuming, and is concerned that despite being told that the blockages she has are not significant(they seem like it to her) and she worries her SOB may be because of it.. No CP  She  does have palpitations, but not like her AFib, occurs most days, but are fleeting/two very fast/pounding beats, and take her breath away when she has them  No bleeding, signs of bleeding No near syncope or syncope  Arrhythmia/AAD hx AFib ~ April 2025 AFib ablation, watchman 01/26/24  Studies Reviewed: SABRA    EKG done today and reviewed by myself:  SB 52bpm  04/06/24: CT IMPRESSION: 1. Well placed 27 mm Watchman FLX device. No leak, average compression 15% Fully endothelialized 2.  Moderate bi atrial enlargement 3. 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  Severe dilatation of the ascending thoracic aorta 4.8 cm 5.  Normal PV anatomy see measurements above 6. Severe 3 vessel calcium  with score 3189, 99 th percentile for age/sex   01/26/24: EPS/ablation/LAAO Transeptal Puncture Intra-procedural TEE which showed no LAA thrombus Left atrial appendage occlusive device placement on 01/26/24 by Dr. Holts.  PVI Ablation 01/26/24   This study demonstrated: 1. Successful PVI 2. Successful Watchman device implant (left atrial appendage occlusion) 3. Intracardiac echo reveals trivial pericardial effusion, normal left atrial architecture 4. Transesophageal echo reveals trivial pericardial effusion, no left atrial appendage thrombus, successful left atrial appendage closure 5. No early apparent complications. 6. Colchicine  0.6mg  PO BID x 5 days 7. Protonix  40mg  PO daily x 45 days   01/26/24: intra-procedure TEE 1. Left ventricular ejection fraction, by estimation, is 65 to 70%. The  left ventricle has normal function. There is severe concentric left  ventricular hypertrophy.   2. Right ventricular systolic function is normal. The right ventricular  size is moderately enlarged. Severely increased right ventricular wall  thickness.   3. Patent left atrial appenadge. Prior to procedure 2D sized to a 27 mm  Watchman FLX Pro. Atrial  septal aneurysm noted without pre-procedure  interatrial communication.      Placement of a 24 mm Watchman FLX pro. Nominal shoulder, no leak or  DRT. Average compression of 22%. Iatrogenic shunting was left to right. No  post procedural effusion. Left atrial size was moderately dilated. No left  atrial/left atrial appendage  thrombus was detected.   4. Right atrial size was moderately dilated.   5. The mitral valve is normal in structure. Mild mitral valve  regurgitation. No evidence of mitral stenosis.   6. The aortic valve is tricuspid. Aortic valve regurgitation is mild.   7. Aortic dilatation noted. Aneurysm of the ascending aorta, measuring 48  mm. There is mild (Grade II) plaque involving the ascending aorta.   8. 3D performed of the LAA and demonstrates 3D sized to a 27 mm Watchman  FLX pro. Average compression 21%.   9. Evidence of atrial level shunting detected by color flow Doppler.  10. Patient under general anesthesia for procedure.    11/22/23: LHC   Mid LAD lesion is 75% stenosed.   2nd Diag lesion is 50% stenosed.   LV end diastolic pressure is normal.   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 Normal LV EDP Right radial loop   Plan; recommend medical therapy for her CAD. She has no active angina. Follow thoracic aneurysm with serial CT. From a coronary standpoint there is no contraindication to proceeding with Afib ablation.   Risk Assessment/Calculations:    Physical Exam:   VS:  There were no vitals taken for this visit.   Wt Readings from Last 3 Encounters:  02/27/24 199 lb 3.2 oz (90.4 kg)  01/26/24 185 lb (83.9 kg)  01/12/24 186 lb (84.4 kg)    GEN: Well nourished, well developed in no acute distress NECK: No JVD; No carotid bruits CARDIAC: RRR, no murmurs, rubs, gallops RESPIRATORY:  CTA b/l without rales, wheezing or rhonchi  ABDOMEN: Soft, non-tender, non-distended EXTREMITIES: No edema;  No deformity    ASSESSMENT AND PLAN: .    paroxysmal AFib CHA2DS2Vasc is 3, on Eliquis , appropriately dosed Now s/p AFib ablation and wathcman implant Low/no burden by symptoms  Discussed dental prophylaxis and Rx As of 04/28/24 Stop Eliquis , start Plavix  75mg  daily EP follow up ~ April at her post procedure 6 mo mark > anticipate ASA alone at that time   Dilated thoracic Aorta Coronary Ca HTN Being followed by attending cardiology team She remains with some DOE, she does have some CAD by cath (75% LAD) No CP > I will forward note to Dr. Kate for his recommendations, plann f/u with him/his team   Secondary hypercoagulable state 2/2 AFib  6.  Palpitations ? PVCs DOE, chronotropic on BB? Will have her wear a 7 day monitor to evaluate these different palpitations and DOE/assess chronotropic response   Dispo: as above, sooner if needed  Signed, Charlies Macario Arthur, PA-C   "

## 2024-04-26 ENCOUNTER — Ambulatory Visit

## 2024-04-26 ENCOUNTER — Ambulatory Visit: Attending: Physician Assistant | Admitting: Physician Assistant

## 2024-04-26 VITALS — BP 128/64 | HR 52 | Ht 66.0 in | Wt 193.0 lb

## 2024-04-26 DIAGNOSIS — I10 Essential (primary) hypertension: Secondary | ICD-10-CM | POA: Diagnosis not present

## 2024-04-26 DIAGNOSIS — I2510 Atherosclerotic heart disease of native coronary artery without angina pectoris: Secondary | ICD-10-CM

## 2024-04-26 DIAGNOSIS — I719 Aortic aneurysm of unspecified site, without rupture: Secondary | ICD-10-CM | POA: Diagnosis not present

## 2024-04-26 DIAGNOSIS — D6869 Other thrombophilia: Secondary | ICD-10-CM | POA: Diagnosis not present

## 2024-04-26 DIAGNOSIS — R002 Palpitations: Secondary | ICD-10-CM

## 2024-04-26 DIAGNOSIS — I480 Paroxysmal atrial fibrillation: Secondary | ICD-10-CM

## 2024-04-26 MED ORDER — CLOPIDOGREL BISULFATE 75 MG PO TABS: 75.0000 mg | ORAL_TABLET | Freq: Every day | ORAL | 3 refills | Status: AC

## 2024-04-26 MED ORDER — AMOXICILLIN 500 MG PO CAPS: 2000.0000 mg | ORAL_CAPSULE | ORAL | 0 refills | Status: AC | PRN

## 2024-04-26 NOTE — Progress Notes (Unsigned)
 Enrolled for [employer] to mail a ZIO AT Live Telemetry monitor to patients address on file.   Dr. Daneil Dunker to read.

## 2024-04-26 NOTE — Patient Instructions (Addendum)
 Medication Instructions:   AS OF  04-28-24  STOP TAKING  ELIQUIS     THEN  START  TAKING  PLAVIX   75 MG ONCE  A  DAY     START  TAKING  AMOXICILLIN   500 MG  (2000 GR   TAKE 4 TABLETS) AS NEEDED   1 HOUR PRIOR  TO DENTAL WORK     *If you need a refill on your cardiac medications before your next appointment, please call your pharmacy*    Lab Work:  NONE ORDERED  TODAY      If you have labs (blood work) drawn today and your tests are completely normal, you will receive your results only by: MyChart Message (if you have MyChart) OR A paper copy in the mail If you have any lab test that is abnormal or we need to change your treatment, we will call you to review the results.    Testing/Procedures:  NONE ORDERED  TODAY     Follow-Up: At [facility], you and your health needs are our priority.  As part of our continuing mission to provide you with exceptional heart care, our providers are all part of one team.  This team includes your primary Cardiologist (physician) and Advanced Practice Providers or APPs (Physician Assistants and Nurse Practitioners) who all work together to provide you with the care you need, when you need it.  Your next appointment:  [facility] NEXT AVAILABLE   AND     NEW PATIENT   3 month(s)   Provider:  Fonda Kitty, MD  ( CONTACT  CASSIE HALL/ ANGELINE HAMMER FOR EP SCHEDULING ISSUES )  We recommend signing up for the patient portal called MyChart.  Sign up information is provided on this After Visit Summary.  MyChart is used to connect with patients for Virtual Visits (Telemedicine).  Patients are able to view lab/test results, encounter notes, upcoming appointments, etc.  Non-urgent messages can be sent to your provider as well.   To learn more about what you can do with MyChart, go to [email or web address].   Other Instructions

## 2024-04-30 ENCOUNTER — Other Ambulatory Visit: Payer: Self-pay | Admitting: Cardiology

## 2024-05-02 NOTE — Progress Notes (Signed)
 " Cardiology Office Note:    Date:  05/03/2024   ID:  Monique Harrell, DOB 1955/10/23, MRN [id number]  PCP:  Monique Leni Edyth DELENA, MD  Cardiologist:  Monique LITTIE Nanas, MD  Electrophysiologist:  Monique ONEIDA HOLTS, MD (Inactive)   Referring MD: Monique Leni Edyth DELENA, MD   Chief Complaint  Patient presents with   Coronary Artery Disease    History of Present Illness:    Monique Harrell is a 69 y.o. female with a hx of CAD, paroxysmal atrial fibrillation, hypertension, hyperlipidemia, hypothyroidism who presents for follow-up.  She was referred by Dr. Maree for evaluation of atrial fibrillation, initially seen 08/18/2023  Echocardiogram 03/09/2022: EF 55 to 60%, mild LVH.  Zio patch x 14 days 07/2023 showed 90 episodes of SVT, longest lasting 17 seconds with average rate 143 bpm, 2% atrial fibrillation burden with average rate 130 bpm and longest episode lasting 5 hours.  Echocardiogram 11/01/2023 showed aortic aneurysm measuring 50 mm, normal biventricular function, no significant valvular disease.  Coronary calcium  score 2592 (99th percentile).  LHC 11/22/2023 showed 75% mid LAD stenosis, 50% D2 stenosis.  Medical management recommended as no active angina.  Reported significant swelling at right femoral access site and underwent arterial duplex which showed hematoma but no evidence of pseudoaneurysm or AV fistula.  Underwent A-fib ablation/Watchman implant on 01/26/2024.  Since last clinic visit, she reports she continues to have dyspnea on exertion.  States that she feels short of breath when she vacuums or takes brisk walk.  States that walking up a flight of stairs will feel short of breath.  Denies any chest pain  Past Medical History:  Diagnosis Date   Arthritis    Dysrhythmia    Hypertension    Hypothyroidism    Presence of Watchman left atrial appendage closure device 01/26/2024   27 mm device, CL   Thyroid  disease     Past Surgical History:  Procedure Laterality Date    ABDOMINAL HYSTERECTOMY     at age 24   ATRIAL FIBRILLATION ABLATION N/A 01/26/2024   Procedure: ATRIAL FIBRILLATION ABLATION;  Surgeon: Harrell Monique ONEIDA, MD;  Location: [facility] INVASIVE CV LAB;  Service: Cardiovascular;  Laterality: N/A;   BREAST BIOPSY Right    BREAST BIOPSY Left    JOINT REPLACEMENT Right    @ age 69 in [place]  Dr Mercer   LEFT ATRIAL APPENDAGE OCCLUSION N/A 01/26/2024   Procedure: LEFT ATRIAL APPENDAGE OCCLUSION;  Surgeon: Harrell Monique ONEIDA, MD;  Location: [facility] INVASIVE CV [facility];  Service: Cardiovascular;  Laterality: N/A;   LEFT HEART CATH AND CORONARY ANGIOGRAPHY N/A 11/22/2023   Procedure: LEFT HEART CATH AND CORONARY ANGIOGRAPHY;  Surgeon: Harrell, Monique M, MD;  Location: [facility] INVASIVE CV LAB;  Service: Cardiovascular;  Laterality: N/A;   thyroidectomy  2016   TRANSESOPHAGEAL ECHOCARDIOGRAM (CATH LAB) N/A 01/26/2024   Procedure: TRANSESOPHAGEAL ECHOCARDIOGRAM;  Surgeon: Harrell Monique ONEIDA, MD;  Location: [facility] INVASIVE CV LAB;  Service: Cardiovascular;  Laterality: N/A;    Current Medications: Current Meds  Medication Sig   acetaminophen  (TYLENOL ) 500 MG tablet Take 500-1,000 mg by mouth every 6 (six) hours as needed (pain.).   alendronate (FOSAMAX) 70 MG tablet Take 70 mg by mouth every Friday.   amLODipine  (NORVASC ) 5 MG tablet Take 1 tablet (5 mg total) by mouth daily.   amoxicillin  (AMOXIL ) 500 MG capsule Take 4 capsules (2,000 mg total) by mouth as needed (1 HOUR PRIOR TO DENTAL PROCEDURES).   atorvastatin  (LIPITOR) 80 MG tablet  Take 1 tablet (80 mg total) by mouth daily.   Cholecalciferol (VITAMIN D-3 PO) Take 2,000 mcg by mouth daily.   clopidogrel  (PLAVIX ) 75 MG tablet Take 1 tablet (75 mg total) by mouth daily.   colchicine  0.6 MG tablet Take 1 tablet (0.6 mg total) by mouth 2 (two) times daily for 5 days.   isosorbide  mononitrate (IMDUR ) 30 MG 24 hr tablet Take 0.5 tablets (15 mg total) by mouth daily.   levothyroxine  (SYNTHROID ) 137 MCG tablet Take 137 mcg by mouth every  morning.   metoprolol  succinate (TOPROL  XL) 25 MG 24 hr tablet Take 1 tablet (25 mg total) by mouth daily.   Multiple Vitamins-Minerals (PRESERVISION AREDS 2 PO) Take 1 tablet by mouth at bedtime.   nitroGLYCERIN  (NITROSTAT ) 0.4 MG SL tablet Place 1 tablet (0.4 mg total) under the tongue every 5 (five) minutes as needed for chest pain.   pantoprazole  (PROTONIX ) 40 MG tablet Take 1 tablet (40 mg total) by mouth daily.   traMADol (ULTRAM) 50 MG tablet Take 50 mg by mouth every 12 (twelve) hours as needed (pain.).     Allergies:   Quinolones   Social History   Socioeconomic History   Marital status: Single    Spouse name: Not on file   Number of children: Not on file   Years of education: Not on file   Highest education level: Not on file  Occupational History   Not on file  Tobacco Use   Smoking status: Former    Current packs/day: 0.00    Types: Cigarettes    Quit date: 09/06/2008    Years since quitting: 15.6   Smokeless tobacco: Never   Tobacco comments:    Former smoker 02/27/24  Vaping Use   Vaping status: Never Used  Substance and Sexual Activity   Alcohol use: Not Currently   Drug use: Never   Sexual activity: Not Currently  Other Topics Concern   Not on file  Social History Narrative   Not on file   Social Drivers of Health   Tobacco Use: Medium Risk (05/03/2024)   Patient History    Smoking Tobacco Use: Former    Smokeless Tobacco Use: Never    Passive Exposure: Not on Actuary Strain: Not on file  Food Insecurity: No Food Insecurity (01/26/2024)   Epic    Worried About Programme Researcher, Broadcasting/film/video in the Last Year: Never true    Ran Out of Food in the Last Year: Never true  Transportation Needs: No Transportation Needs (01/26/2024)   Epic    Lack of Transportation (Medical): No    Lack of Transportation (Non-Medical): No  Physical Activity: Not on file  Stress: Not on file  Social Connections: Not on file  Depression (EYV7-0): Not on file   Alcohol Screen: Not on file  Housing: Low Risk (01/26/2024)   Epic    Unable to Pay for Housing in the Last Year: No    Number of Times Moved in the Last Year: 0    Homeless in the Last Year: No  Utilities: Not At Risk (01/26/2024)   Epic    Threatened with loss of utilities: No  Health Literacy: Not on file     Family History: The patient's family history includes Breast cancer in her mother; Cancer in her brother, father, and mother.  ROS:   Please see the history of present illness.     All other systems reviewed and are negative.  EKGs/Labs/Other Studies Reviewed:  The following studies were reviewed today:   EKG:  06/28/2023: Atrial fibrillation, rate 140  Recent Labs: 12/09/2023: ALT 25 01/05/2024: BUN 14; Creatinine, Ser 0.80; Hemoglobin 13.4; Platelets 207; Potassium 4.4; Sodium 140  Recent Lipid Panel    Component Value Date/Time   CHOL 218 (H) 11/16/2023 1538   TRIG 215 (H) 11/16/2023 1538   HDL 64 11/16/2023 1538   CHOLHDL 3.4 11/16/2023 1538   LDLCALC 117 (H) 11/16/2023 1538   LDLDIRECT 126 (H) 11/16/2023 1538    Physical Exam:    VS:  BP 108/72 (BP Location: Right Arm, Patient Position: Sitting, Cuff Size: Normal)   Pulse (!) 51   Ht 5' 6 (1.676 m)   Wt 198 lb 3.2 oz (89.9 kg)   SpO2 98%   BMI 31.99 kg/m     Wt Readings from Last 3 Encounters:  05/03/24 198 lb 3.2 oz (89.9 kg)  04/26/24 193 lb (87.5 kg)  02/27/24 199 lb 3.2 oz (90.4 kg)     GEN:  in no acute distress HEENT: Normal NECK: No JVD; No carotid bruits CARDIAC: RRR, no murmurs, rubs, gallops RESPIRATORY:  Clear to auscultation without rales, wheezing or rhonchi  ABDOMEN: Soft, non-tender, non-distended MUSCULOSKELETAL:  No edema; No deformity  SKIN: Warm and dry NEUROLOGIC:  Alert and oriented x 3 PSYCHIATRIC:  Normal affect   ASSESSMENT:    1. Coronary artery disease of native artery of native heart with stable angina pectoris   2. Paroxysmal atrial fibrillation (HCC)    3. Hyperlipidemia, unspecified hyperlipidemia type   4. Aortic aneurysm without rupture, unspecified portion of aorta   5. Essential hypertension      PLAN:    CAD: Coronary calcium  score 2592 (99th percentile).  LHC 11/22/2023 showed 75% mid LAD stenosis, 50% D2 stenosis.  Medical management recommended as no active angina.  Reported significant swelling at right femoral access site and underwent arterial duplex which showed hematoma but no evidence of pseudoaneurysm or AV fistula. -Continue Plavix  -Continue Toprol -XL -Continue atorvastatin  80 mg daily -She is having dyspnea on exertion that suspect is due to her obstructive CAD.  Current antianginal regimen includes amlodipine  5 mg daily and Toprol -XL 25 mg daily.  Discussed undergoing PCI to LAD but she wishes to hold off at this time.  Will add Imdur  15 mg daily.  Discussed that if symptoms not relieved with antianginals would recommend PCI  Atrial fibrillation: Zio patch x 14 days 07/2023 showed 90 episodes of SVT, longest lasting 17 seconds with average rate 143 bpm, 2% atrial fibrillation burden with average rate 130 bpm and longest episode lasting 5 hours.  CHA2DS2-VASc 3 (hypertension, age, female). Echocardiogram 03/09/2022: EF 55 to 60%, mild LVH. Echocardiogram 11/01/2023 showed aortic aneurysm measuring 50 mm, normal biventricular function, no significant valvular disease. - Was taking Eliquis  5 mg twice daily.  She had been taking daily meloxicam  for arthritis, discussed that would not recommend taking daily NSAIDs while on Eliquis  and instead recommend Tylenol  for pain.  She is willing to try this for short-term but does not wish to take long-term anticoagulation.  Referred to EP, underwent ablation/Watchman on 01/26/24.  She is now off Eliquis , on Plavix  75 mg daily with plans to switch to aspirin  in April per  EP - Continue Toprol -XL 25 mg daily  Ascending aortic aneurysm: Measured 50mm on echocardiogram and CTA.  Follow-up CTA 12/2023  showed aneurysm measured 48 mm.  Follows with CT surgery  Hypertension: On amlodipine  5 mg daily and Toprol -XL 25  mg daily.  Appears controlled  Hyperlipidemia: On atorvastatin  40 daily, LDL 117 on 11/16/2023.  Atorvastatin  increased to 80 mg daily for goal LDL less than 70.  Check lipid panel  Snoring/daytime somnolence: No evidence of sleep apnea on sleep study 12/2023  Tobacco use: Congratulated patient on quitting smoking and encouraged continued cessation  RTC in 2 months  Medication Adjustments/Labs and Tests Ordered: Current medicines are reviewed at length with the patient today.  Concerns regarding medicines are outlined above.  Orders Placed This Encounter  Procedures   Lipid panel   Meds ordered this encounter  Medications   isosorbide  mononitrate (IMDUR ) 30 MG 24 hr tablet    Sig: Take 0.5 tablets (15 mg total) by mouth daily.    Dispense:  45 tablet    Refill:  3    Patient Instructions  Medication Instructions:  Your physician has recommended you make the following change in your medication:  START: Imdur  15 mg once daily *If you need a refill on your cardiac medications before your next appointment, please call your pharmacy*  Lab Work: Lipids If you have labs (blood work) drawn today and your tests are completely normal, you will receive your results only by: MyChart Message (if you have MyChart) OR A paper copy in the mail If you have any lab test that is abnormal or we need to change your treatment, we will call you to review the results.  Follow-Up: At [facility], you and your health needs are our priority.  As part of our continuing mission to provide you with exceptional heart care, our providers are all part of one team.  This team includes your primary Cardiologist (physician) and Advanced Practice Providers or APPs (Physician Assistants and Nurse Practitioners) who all work together to provide you with the care you need, when you need  it.  Your next appointment:   2 month(s)  Provider:   [name], MD     Other Instructions:            Signed, Monique LITTIE Nanas, MD  05/03/2024 4:49 PM    [facility] "

## 2024-05-03 ENCOUNTER — Ambulatory Visit: Attending: Cardiology | Admitting: Cardiology

## 2024-05-03 ENCOUNTER — Encounter: Payer: Self-pay | Admitting: Cardiology

## 2024-05-03 VITALS — BP 108/72 | HR 51 | Ht 66.0 in | Wt 198.2 lb

## 2024-05-03 DIAGNOSIS — I10 Essential (primary) hypertension: Secondary | ICD-10-CM | POA: Diagnosis not present

## 2024-05-03 DIAGNOSIS — E785 Hyperlipidemia, unspecified: Secondary | ICD-10-CM

## 2024-05-03 DIAGNOSIS — I719 Aortic aneurysm of unspecified site, without rupture: Secondary | ICD-10-CM

## 2024-05-03 DIAGNOSIS — I25118 Atherosclerotic heart disease of native coronary artery with other forms of angina pectoris: Secondary | ICD-10-CM | POA: Diagnosis not present

## 2024-05-03 DIAGNOSIS — I480 Paroxysmal atrial fibrillation: Secondary | ICD-10-CM

## 2024-05-03 MED ORDER — ISOSORBIDE MONONITRATE ER 30 MG PO TB24: 15.0000 mg | ORAL_TABLET | Freq: Every day | ORAL | 3 refills | Status: AC

## 2024-05-03 NOTE — Patient Instructions (Signed)
 Medication Instructions:  Your physician has recommended you make the following change in your medication:  START: Imdur  15 mg once daily *If you need a refill on your cardiac medications before your next appointment, please call your pharmacy*  Lab Work: Lipids If you have labs (blood work) drawn today and your tests are completely normal, you will receive your results only by: MyChart Message (if you have MyChart) OR A paper copy in the mail If you have any lab test that is abnormal or we need to change your treatment, we will call you to review the results.  Follow-Up: At [facility], you and your health needs are our priority.  As part of our continuing mission to provide you with exceptional heart care, our providers are all part of one team.  This team includes your primary Cardiologist (physician) and Advanced Practice Providers or APPs (Physician Assistants and Nurse Practitioners) who all work together to provide you with the care you need, when you need it.  Your next appointment:   2 month(s)  Provider:   [name], MD     Other Instructions:

## 2024-05-09 ENCOUNTER — Ambulatory Visit: Payer: Self-pay | Admitting: Cardiology

## 2024-05-09 LAB — LIPID PANEL
Chol/HDL Ratio: 2.5 ratio (ref 0.0–4.4)
Cholesterol, Total: 157 mg/dL (ref 100–199)
HDL: 64 mg/dL
LDL Chol Calc (NIH): 60 mg/dL (ref 0–99)
Triglycerides: 203 mg/dL — ABNORMAL HIGH (ref 0–149)
VLDL Cholesterol Cal: 33 mg/dL (ref 5–40)

## 2024-05-20 DIAGNOSIS — R002 Palpitations: Secondary | ICD-10-CM | POA: Diagnosis not present

## 2024-05-21 ENCOUNTER — Ambulatory Visit: Payer: Self-pay | Admitting: Physician Assistant

## 2024-08-23 ENCOUNTER — Ambulatory Visit: Admitting: Cardiology
